# Patient Record
Sex: Female | Born: 1935 | Race: Black or African American | Hispanic: No | Marital: Single | Smoking: Former smoker
Health system: Southern US, Community
[De-identification: ages and names within clinical notes are randomized; demographics above are authoritative.]

## PROBLEM LIST (undated history)

## (undated) DIAGNOSIS — I10 Essential (primary) hypertension: Secondary | ICD-10-CM

## (undated) DIAGNOSIS — E119 Type 2 diabetes mellitus without complications: Secondary | ICD-10-CM

## (undated) HISTORY — PX: ABDOMINAL HYSTERECTOMY: SHX81

---

## 2014-08-25 ENCOUNTER — Other Ambulatory Visit: Payer: Self-pay | Admitting: Family Medicine

## 2014-08-25 DIAGNOSIS — R109 Unspecified abdominal pain: Secondary | ICD-10-CM

## 2014-08-25 DIAGNOSIS — R748 Abnormal levels of other serum enzymes: Secondary | ICD-10-CM

## 2014-08-25 DIAGNOSIS — R634 Abnormal weight loss: Secondary | ICD-10-CM

## 2014-08-31 ENCOUNTER — Ambulatory Visit
Admission: RE | Admit: 2014-08-31 | Discharge: 2014-08-31 | Disposition: A | Discharge status: Home / Self Care | Payer: Medicare Other | Source: Ambulatory Visit | Attending: Family Medicine | Admitting: Family Medicine

## 2014-08-31 DIAGNOSIS — R634 Abnormal weight loss: Secondary | ICD-10-CM

## 2014-08-31 DIAGNOSIS — R109 Unspecified abdominal pain: Secondary | ICD-10-CM

## 2014-08-31 DIAGNOSIS — R748 Abnormal levels of other serum enzymes: Secondary | ICD-10-CM

## 2014-12-30 ENCOUNTER — Other Ambulatory Visit (HOSPITAL_COMMUNITY): Payer: Self-pay | Admitting: Orthopedic Surgery

## 2014-12-30 DIAGNOSIS — M25562 Pain in left knee: Secondary | ICD-10-CM

## 2015-01-09 ENCOUNTER — Encounter (HOSPITAL_COMMUNITY): Admission: RE | Admit: 2015-01-09 | Payer: Medicare Other | Source: Ambulatory Visit

## 2015-01-09 ENCOUNTER — Encounter (HOSPITAL_COMMUNITY): Payer: Medicare Other

## 2015-01-26 ENCOUNTER — Encounter (HOSPITAL_COMMUNITY)
Admission: RE | Admit: 2015-01-26 | Discharge: 2015-01-26 | Disposition: A | Discharge status: Home / Self Care | Payer: Medicare Other | Source: Ambulatory Visit | Attending: Orthopedic Surgery | Admitting: Orthopedic Surgery

## 2015-01-26 DIAGNOSIS — M25562 Pain in left knee: Secondary | ICD-10-CM | POA: Insufficient documentation

## 2015-01-26 MED ORDER — TECHNETIUM TC 99M MEDRONATE IV KIT
25.0000 | PACK | Freq: Once | INTRAVENOUS | Status: AC | PRN
  Administered 2015-01-26: 25 via INTRAVENOUS

## 2015-01-26 MED ORDER — TECHNETIUM TC 99M MEDRONATE IV KIT: 25.0000 | PACK | Freq: Once | INTRAVENOUS | Status: AC | PRN

## 2015-07-04 ENCOUNTER — Encounter (HOSPITAL_COMMUNITY): Payer: Self-pay | Admitting: Emergency Medicine

## 2015-07-04 ENCOUNTER — Emergency Department (HOSPITAL_COMMUNITY): Payer: Medicare Other

## 2015-07-04 ENCOUNTER — Emergency Department (HOSPITAL_COMMUNITY)
Admission: EM | Admit: 2015-07-04 | Discharge: 2015-07-04 | Disposition: A | Discharge status: Home / Self Care | Payer: Medicare Other | Attending: Emergency Medicine | Admitting: Emergency Medicine

## 2015-07-04 DIAGNOSIS — E119 Type 2 diabetes mellitus without complications: Secondary | ICD-10-CM | POA: Diagnosis not present

## 2015-07-04 DIAGNOSIS — Z87891 Personal history of nicotine dependence: Secondary | ICD-10-CM | POA: Diagnosis not present

## 2015-07-04 DIAGNOSIS — I10 Essential (primary) hypertension: Secondary | ICD-10-CM | POA: Insufficient documentation

## 2015-07-04 DIAGNOSIS — R262 Difficulty in walking, not elsewhere classified: Secondary | ICD-10-CM | POA: Diagnosis not present

## 2015-07-04 DIAGNOSIS — F0390 Unspecified dementia without behavioral disturbance: Secondary | ICD-10-CM | POA: Diagnosis not present

## 2015-07-04 HISTORY — DX: Essential (primary) hypertension: I10

## 2015-07-04 HISTORY — DX: Type 2 diabetes mellitus without complications: E11.9

## 2015-07-04 LAB — CBC WITH DIFFERENTIAL/PLATELET
BASOS ABS: 0 10*3/uL (ref 0.0–0.1)
BASOS PCT: 0 % (ref 0–1)
Eosinophils Absolute: 0.1 10*3/uL (ref 0.0–0.7)
Eosinophils Relative: 1 % (ref 0–5)
HCT: 28.5 % — ABNORMAL LOW (ref 36.0–46.0)
Hemoglobin: 9.2 g/dL — ABNORMAL LOW (ref 12.0–15.0)
Lymphocytes Relative: 27 % (ref 12–46)
Lymphs Abs: 1.9 10*3/uL (ref 0.7–4.0)
MCH: 28 pg (ref 26.0–34.0)
MCHC: 32.3 g/dL (ref 30.0–36.0)
MCV: 86.6 fL (ref 78.0–100.0)
MONO ABS: 0.4 10*3/uL (ref 0.1–1.0)
MONOS PCT: 5 % (ref 3–12)
NEUTROS ABS: 4.7 10*3/uL (ref 1.7–7.7)
Neutrophils Relative %: 67 % (ref 43–77)
PLATELETS: 221 10*3/uL (ref 150–400)
RBC: 3.29 MIL/uL — AB (ref 3.87–5.11)
RDW: 15 % (ref 11.5–15.5)
WBC: 7 10*3/uL (ref 4.0–10.5)

## 2015-07-04 LAB — I-STAT TROPONIN, ED: Troponin i, poc: 0 ng/mL (ref 0.00–0.08)

## 2015-07-04 LAB — URINALYSIS, ROUTINE W REFLEX MICROSCOPIC
Bilirubin Urine: NEGATIVE
Glucose, UA: NEGATIVE mg/dL
HGB URINE DIPSTICK: NEGATIVE
KETONES UR: NEGATIVE mg/dL
Nitrite: NEGATIVE
PROTEIN: 100 mg/dL — AB
Specific Gravity, Urine: 1.014 (ref 1.005–1.030)
UROBILINOGEN UA: 0.2 mg/dL (ref 0.0–1.0)
pH: 5.5 (ref 5.0–8.0)

## 2015-07-04 LAB — COMPREHENSIVE METABOLIC PANEL
ALBUMIN: 3.6 g/dL (ref 3.5–5.0)
ALT: 13 U/L — AB (ref 14–54)
ANION GAP: 7 (ref 5–15)
AST: 20 U/L (ref 15–41)
Alkaline Phosphatase: 78 U/L (ref 38–126)
BUN: 46 mg/dL — AB (ref 6–20)
CO2: 21 mmol/L — AB (ref 22–32)
CREATININE: 1.6 mg/dL — AB (ref 0.44–1.00)
Calcium: 9.6 mg/dL (ref 8.9–10.3)
Chloride: 111 mmol/L (ref 101–111)
GFR calc Af Amer: 32 mL/min — ABNORMAL LOW (ref >60)
GFR calc non Af Amer: 28 mL/min — ABNORMAL LOW (ref >60)
Glucose, Bld: 122 mg/dL — ABNORMAL HIGH (ref 65–99)
Potassium: 4.8 mmol/L (ref 3.5–5.1)
Sodium: 139 mmol/L (ref 135–145)
Total Bilirubin: 0.4 mg/dL (ref 0.3–1.2)
Total Protein: 6.7 g/dL (ref 6.5–8.1)

## 2015-07-04 LAB — URINE MICROSCOPIC-ADD ON

## 2015-07-04 NOTE — ED Provider Notes (Signed)
CSN: [id number]     Arrival date & time 07/04/15  1100 History   First MD Initiated Contact with Patient 07/04/15 1106     Chief Complaint  Patient presents with  . Dementia     The history is provided by the patient and a relative. No language interpreter was used.   Ms. Kelly Pratt presents for evaluation of difficulty walking. Level V caveat due to dementia. Patient denies any complaints but family report that she is moving slower and is more hunched than usual. No reports of fevers, vomiting, abdominal pain, back pain. She states her right leg hurts sometimes. No recent injury. Family reports that she's had difficulty initiating gait since yesterday, this is intermittent. They think her speech is been a little different for the last several days.  Past Medical History  Diagnosis Date  . Diabetes mellitus without complication   . Hypertension    History reviewed. No pertinent past surgical history. History reviewed. No pertinent family history. History  Substance Use Topics  . Smoking status: Former Games developer  . Smokeless tobacco: Not on file  . Alcohol Use: No   OB History    No data available     Review of Systems  All other systems reviewed and are negative.     Allergies  Codeine  Home Medications   Prior to Admission medications   Not on File   There were no vitals taken for this visit. Physical Exam  Constitutional: She appears well-developed and well-nourished.  HENT:  Head: Normocephalic and atraumatic.  Eyes:  Left pupil is irregular and unreactive  Cardiovascular: Normal rate and regular rhythm.   No murmur heard. Pulmonary/Chest: Effort normal and breath sounds normal. No respiratory distress.  Abdominal: Soft. There is no tenderness. There is no rebound and no guarding.  Musculoskeletal: She exhibits no edema or tenderness.  2+ DP pulses bilaterally. No discrete tenderness over bilateral lower extremities.  Neurological: She is alert.  Disoriented to  time. Moves all extremities symmetrically.  Skin: Skin is warm and dry.  Psychiatric: She has a normal mood and affect. Her behavior is normal.  Nursing note and vitals reviewed.   ED Course  Procedures (including critical care time) Labs Review Labs Reviewed  URINALYSIS, ROUTINE W REFLEX MICROSCOPIC (NOT AT [facility]) - Abnormal; Notable for the following:    Protein, ur 100 (*)    Leukocytes, UA TRACE (*)    All other components within normal limits  COMPREHENSIVE METABOLIC PANEL - Abnormal; Notable for the following:    CO2 21 (*)    Glucose, Bld 122 (*)    BUN 46 (*)    Creatinine, Ser 1.60 (*)    ALT 13 (*)    GFR calc non Af Amer 28 (*)    GFR calc Af Amer 32 (*)    All other components within normal limits  CBC WITH DIFFERENTIAL/PLATELET - Abnormal; Notable for the following:    RBC 3.29 (*)    Hemoglobin 9.2 (*)    HCT 28.5 (*)    All other components within normal limits  URINE MICROSCOPIC-ADD ON - Abnormal; Notable for the following:    Squamous Epithelial / LPF FEW (*)    All other components within normal limits  URINE CULTURE  I-STAT TROPOININ, ED    Imaging Review Ct Head Wo Contrast  07/04/2015   CLINICAL DATA:  Slurred speech, gait abnormality and history of Alzheimer's dementia.  EXAM: CT HEAD WITHOUT CONTRAST  TECHNIQUE: Contiguous axial images were  obtained from the base of the skull through the vertex without intravenous contrast.  COMPARISON:  None.  FINDINGS: Old infarct identified in the left frontal cortex. There may also be a small old infarct near the vertex in the right subcortical frontoparietal brain. Advanced small vessel disease is present in the periventricular white matter and extending into both thalami. No acute hemorrhage, mass effect, extra-axial fluid collection or hydrocephalus identified. No evidence of mass lesion. The skull is unremarkable.  IMPRESSION: No acute findings. Old infarcts identified as well as extensive small vessel disease.    Electronically Signed   By: Irish LackGlenn  Yamagata M.D.   On: 07/04/2015 12:04     EKG Interpretation   Date/Time:  Tuesday July 04 2015 12:05:12 EDT Ventricular Rate:  69 PR Interval:  179 QRS Duration: 83 QT Interval:  415 QTC Calculation: 445 R Axis:   31 Text Interpretation:  Sinus rhythm Atrial premature complex Probable left  atrial enlargement Abnormal R-wave progression, early transition  Borderline T abnormalities, inferior leads Baseline wander in lead(s) II  III aVF Confirmed by Lincoln Brighamees, Liz [phone]) on 07/04/2015 1:22:59 PM      MDM   Final diagnoses:  Difficulty walking    Pt here for intermittent difficulty walking, sxs improved in ED. No focal neuro deficits, clinical picture not c/w TIA/CVA.  Pt with prior Cr 1.6 in Aug 2015 and Hgb 9.8 in August 2015 per discussion with her PCP office.  No evidence of ARF or acute anemia.  UA with leukocytes but no other evidence of infection - will send culture.  Discussed with family home care with outpatient follow up, return precautions.    In terms of knee pain - pt without active or reproducible pain in department, she has a hx/o knee/leg pain.     Tilden FossaElizabeth Kenora Spayd, MD 07/04/15 [phone]

## 2015-07-04 NOTE — ED Notes (Signed)
MD at bedside. 

## 2015-07-04 NOTE — ED Notes (Signed)
Nurse currently drawing labs 

## 2015-07-04 NOTE — Discharge Instructions (Signed)
Your labs look stable today.  You have some kidney disease with a creatinine of 1.6 and anemia with a hemoglobin of 9.2.  Please follow up with your family doctor for recheck.     Fall Prevention and Home Safety Falls cause injuries and can affect all age groups. It is possible to use preventive measures to significantly decrease the likelihood of falls. There are many simple measures which can make your home safer and prevent falls. OUTDOORS  Repair cracks and edges of walkways and driveways.  Remove high doorway thresholds.  Trim shrubbery on the main path into your home.  Have good outside lighting.  Clear walkways of tools, rocks, debris, and clutter.  Check that handrails are not broken and are securely fastened. Both sides of steps should have handrails.  Have leaves, snow, and ice cleared regularly.  Use sand or salt on walkways during winter months.  In the garage, clean up grease or oil spills. BATHROOM  Install night lights.  Install grab bars by the toilet and in the tub and shower.  Use non-skid mats or decals in the tub or shower.  Place a plastic non-slip stool in the shower to sit on, if needed.  Keep floors dry and clean up all water on the floor immediately.  Remove soap buildup in the tub or shower on a regular basis.  Secure bath mats with non-slip, double-sided rug tape.  Remove throw rugs and tripping hazards from the floors. BEDROOMS  Install night lights.  Make sure a bedside light is easy to reach.  Do not use oversized bedding.  Keep a telephone by your bedside.  Have a firm chair with side arms to use for getting dressed.  Remove throw rugs and tripping hazards from the floor. KITCHEN  Keep handles on pots and pans turned toward the center of the stove. Use back burners when possible.  Clean up spills quickly and allow time for drying.  Avoid walking on wet floors.  Avoid hot utensils and knives.  Position shelves so they are  not too high or low.  Place commonly used objects within easy reach.  If necessary, use a sturdy step stool with a grab bar when reaching.  Keep electrical cables out of the way.  Do not use floor polish or wax that makes floors slippery. If you must use wax, use non-skid floor wax.  Remove throw rugs and tripping hazards from the floor. STAIRWAYS  Never leave objects on stairs.  Place handrails on both sides of stairways and use them. Fix any loose handrails. Make sure handrails on both sides of the stairways are as long as the stairs.  Check carpeting to make sure it is firmly attached along stairs. Make repairs to worn or loose carpet promptly.  Avoid placing throw rugs at the top or bottom of stairways, or properly secure the rug with carpet tape to prevent slippage. Get rid of throw rugs, if possible.  Have an electrician put in a light switch at the top and bottom of the stairs. OTHER FALL PREVENTION TIPS  Wear low-heel or rubber-soled shoes that are supportive and fit well. Wear closed toe shoes.  When using a stepladder, make sure it is fully opened and both spreaders are firmly locked. Do not climb a closed stepladder.  Add color or contrast paint or tape to grab bars and handrails in your home. Place contrasting color strips on first and last steps.  Learn and use mobility aids as needed. Install an  electrical emergency response system.  Turn on lights to avoid dark areas. Replace light bulbs that burn out immediately. Get light switches that glow.  Arrange furniture to create clear pathways. Keep furniture in the same place.  Firmly attach carpet with non-skid or double-sided tape.  Eliminate uneven floor surfaces.  Select a carpet pattern that does not visually hide the edge of steps.  Be aware of all pets. OTHER HOME SAFETY TIPS  Set the water temperature for 120 F (48.8 C).  Keep emergency numbers on or near the telephone.  Keep smoke detectors on  every level of the home and near sleeping areas. Document Released: 11/29/2002 Document Revised: 06/09/2012 Document Reviewed: 02/28/2012 [facility] Patient Information 2015 [employer] [place]. This information is not intended to replace advice given to you by your health care provider. Make sure you discuss any questions you have with your health care provider.

## 2015-07-04 NOTE — ED Notes (Signed)
Bed: WA20 Expected date:  Expected time:  Means of arrival:  Comments: EMS-difficulty ambulating

## 2015-07-04 NOTE — ED Notes (Signed)
Per EMS- patient herself reports no complaints. Family reports pt has been "moving slower than usual" and is "more hunched over than usual." Hx dementia, Alzheimer's. VS: 181/80 HR 70 RR 16 SpO2 100%. No other c/c.

## 2015-07-04 NOTE — ED Notes (Signed)
Patient transported to CT 

## 2015-07-05 LAB — URINE CULTURE: Culture: NO GROWTH

## 2015-12-18 ENCOUNTER — Emergency Department (HOSPITAL_COMMUNITY): Payer: Medicare Other

## 2015-12-18 ENCOUNTER — Emergency Department (HOSPITAL_COMMUNITY)
Admission: EM | Admit: 2015-12-18 | Discharge: 2015-12-18 | Disposition: A | Discharge status: Home / Self Care | Payer: Medicare Other | Attending: Emergency Medicine | Admitting: Emergency Medicine

## 2015-12-18 ENCOUNTER — Encounter (HOSPITAL_COMMUNITY): Payer: Self-pay | Admitting: Emergency Medicine

## 2015-12-18 DIAGNOSIS — Y998 Other external cause status: Secondary | ICD-10-CM | POA: Diagnosis not present

## 2015-12-18 DIAGNOSIS — Z7984 Long term (current) use of oral hypoglycemic drugs: Secondary | ICD-10-CM | POA: Insufficient documentation

## 2015-12-18 DIAGNOSIS — I10 Essential (primary) hypertension: Secondary | ICD-10-CM | POA: Diagnosis not present

## 2015-12-18 DIAGNOSIS — S8992XA Unspecified injury of left lower leg, initial encounter: Secondary | ICD-10-CM | POA: Insufficient documentation

## 2015-12-18 DIAGNOSIS — Z79899 Other long term (current) drug therapy: Secondary | ICD-10-CM | POA: Insufficient documentation

## 2015-12-18 DIAGNOSIS — W19XXXA Unspecified fall, initial encounter: Secondary | ICD-10-CM

## 2015-12-18 DIAGNOSIS — Z87891 Personal history of nicotine dependence: Secondary | ICD-10-CM | POA: Insufficient documentation

## 2015-12-18 DIAGNOSIS — Y92003 Bedroom of unspecified non-institutional (private) residence as the place of occurrence of the external cause: Secondary | ICD-10-CM | POA: Diagnosis not present

## 2015-12-18 DIAGNOSIS — Y9389 Activity, other specified: Secondary | ICD-10-CM | POA: Diagnosis not present

## 2015-12-18 DIAGNOSIS — W1839XA Other fall on same level, initial encounter: Secondary | ICD-10-CM | POA: Insufficient documentation

## 2015-12-18 DIAGNOSIS — E119 Type 2 diabetes mellitus without complications: Secondary | ICD-10-CM | POA: Diagnosis not present

## 2015-12-18 LAB — CBC WITH DIFFERENTIAL/PLATELET
Basophils Absolute: 0 10*3/uL (ref 0.0–0.1)
Basophils Relative: 0 %
EOS ABS: 0.1 10*3/uL (ref 0.0–0.7)
Eosinophils Relative: 1 %
HEMATOCRIT: 28.5 % — AB (ref 36.0–46.0)
HEMOGLOBIN: 9.3 g/dL — AB (ref 12.0–15.0)
LYMPHS PCT: 15 %
Lymphs Abs: 1.5 10*3/uL (ref 0.7–4.0)
MCH: 28.1 pg (ref 26.0–34.0)
MCHC: 32.6 g/dL (ref 30.0–36.0)
MCV: 86.1 fL (ref 78.0–100.0)
MONOS PCT: 7 %
Monocytes Absolute: 0.7 10*3/uL (ref 0.1–1.0)
NEUTROS ABS: 7.7 10*3/uL (ref 1.7–7.7)
Neutrophils Relative %: 77 %
Platelets: 248 10*3/uL (ref 150–400)
RBC: 3.31 MIL/uL — AB (ref 3.87–5.11)
RDW: 15.4 % (ref 11.5–15.5)
WBC: 9.9 10*3/uL (ref 4.0–10.5)

## 2015-12-18 LAB — URINALYSIS, ROUTINE W REFLEX MICROSCOPIC
Bilirubin Urine: NEGATIVE
Glucose, UA: NEGATIVE mg/dL
KETONES UR: NEGATIVE mg/dL
Leukocytes, UA: NEGATIVE
NITRITE: NEGATIVE
PH: 5.5 (ref 5.0–8.0)
PROTEIN: 100 mg/dL — AB
Specific Gravity, Urine: 1.013 (ref 1.005–1.030)

## 2015-12-18 LAB — COMPREHENSIVE METABOLIC PANEL
ALK PHOS: 92 U/L (ref 38–126)
ALT: 24 U/L (ref 14–54)
AST: 36 U/L (ref 15–41)
Albumin: 3.9 g/dL (ref 3.5–5.0)
Anion gap: 10 (ref 5–15)
BILIRUBIN TOTAL: 0.8 mg/dL (ref 0.3–1.2)
BUN: 52 mg/dL — ABNORMAL HIGH (ref 6–20)
CALCIUM: 9.5 mg/dL (ref 8.9–10.3)
CO2: 20 mmol/L — ABNORMAL LOW (ref 22–32)
Chloride: 112 mmol/L — ABNORMAL HIGH (ref 101–111)
Creatinine, Ser: 1.71 mg/dL — ABNORMAL HIGH (ref 0.44–1.00)
GFR calc non Af Amer: 26 mL/min — ABNORMAL LOW (ref >60)
GFR, EST AFRICAN AMERICAN: 30 mL/min — AB (ref >60)
Glucose, Bld: 111 mg/dL — ABNORMAL HIGH (ref 65–99)
Potassium: 4.8 mmol/L (ref 3.5–5.1)
SODIUM: 142 mmol/L (ref 135–145)
TOTAL PROTEIN: 7.4 g/dL (ref 6.5–8.1)

## 2015-12-18 LAB — URINE MICROSCOPIC-ADD ON

## 2015-12-18 MED ORDER — HYDROCODONE-ACETAMINOPHEN 5-325 MG PO TABS: 1.0000 | ORAL_TABLET | Freq: Four times a day (QID) | ORAL | Status: DC | PRN

## 2015-12-18 MED ORDER — HYDROCODONE-ACETAMINOPHEN 5-325 MG PO TABS
1.0000 | ORAL_TABLET | Freq: Four times a day (QID) | ORAL | Status: DC | PRN
Start: 2015-12-18 — End: 2016-05-14

## 2015-12-18 MED ORDER — OXYCODONE-ACETAMINOPHEN 5-325 MG PO TABS
1.0000 | ORAL_TABLET | Freq: Once | ORAL | Status: AC
  Administered 2015-12-18: 1 via ORAL
  Filled 2015-12-18: qty 1

## 2015-12-18 NOTE — Progress Notes (Addendum)
   12/18/15 0000  CM Assessment  Expected Discharge Plan Home w Home Health Services  In-house Referral Clinical Social Work  Discharge Planning Services CM Consult  [facility]  Choice offered to / list presented to  Patient;Adult Children  HH Arranged RN;PT;Nurse's Aide;OT;Social Work  [facility]  Status of Service Completed, signed off  Discharge Disposition Home w Home Health Services   81 yr old medicare pt fell injury to knee to have knee immobilizer applied for d/c  Daughter at bedside Marcelino DusterMichelle states pt baseline is walking with walker States pt has a bedside commode w/c, walker and cane at [place] pt stays with her for a week and then with her sister for a week.  Pt was with Marcelino DusterMichelle this weekend.  [facility] and choice is to have them for services again If no availability for [facility] second choice [facility]  Cm discussed with Marcelino DusterMichelle that pt can be d/c with assist of EMS Marcelino DusterMichelle states pt will be returning to her sister Geoffery SpruceDebbie, [place] [phone] cell CM updated [facility] address [facility] as [address] [place] [id number] for [place] purposes Updated ED RN   CM reviewed in details medicare guidelines, home health (HH) (length of stay in home, types of [facility] staff available, coverage, primary caregiver, up to 24 hrs before services may be started) and Private duty nursing (PDN-coverage, length of stay in the home types of staff available). CM reviewed availability of HH SW to assist pcp to get pt to snf (if desired disposition) from the community level. CM provided pt/family with a list of [place] home health agencies and PDN.  CM explained medicare guidelines about pt not being able to get to "inpatient rehab" from ED or snf rehab without qualifying 3 day stay Explained that ED is considered outpatient Discussed there is an out of pocket expense for placement not  qualified Marcelino DusterMichelle voiced understanding Discussed that [facility] will call within 24 hr disclaimer to discuss any co pays, schedule of visits and start date, etc Marcelino DusterMichelle voiced understanding Spoke with Debbie via cell phone who also voiced understanding and appreciation for Cm working with her mother  1252 Cm called in referral for services to Clydie BraunKaren [facility] No DME needs

## 2015-12-18 NOTE — ED Notes (Addendum)
Pt transported from home after fall at unkn time during the night. Found on the floor this am by daughter.unkn LOC. C/o L knee pain, A & O  L knee L femur painful on palpation, pt states she had several drinks last night but does was not intoxicated. Pt is unable to estimate what time she fell during the night, she was unable get up or get daughters attention. Pt repeatedly asking what hospital this is.

## 2015-12-18 NOTE — ED Notes (Signed)
Daughter request meal for pt and pt to take morning home medications; per Zammit okay for pt to be given something to eat to take home medications brought with her; minus tylenol (pt given percocet). Pt given ice water, graham crackers, and ice water.

## 2015-12-18 NOTE — ED Notes (Signed)
Nurse from [facility] has come to talk with the pt's daughter.

## 2015-12-18 NOTE — ED Notes (Signed)
Per ortho tech:  After placing knee immobilizer, he stood the pt up at the bedside.  She was able to bear weight without c/o any pain.  EDP made aware.

## 2015-12-18 NOTE — Progress Notes (Signed)
CSW staffed with nurse before speaking with patient. CSW spoke with patient at bedside. Patient stated she was hospitalized due to a fall. Patient then dozed off to sleep. Patients daughter, Kelly Pratt, daughter stated patient was given a percocet.   Patient's daughter stated she and patient arrived home from a party and that they had some wine. Patient's daughter stated her mother's knees were like rubber. Patient's daughter stated her mother had knee replacement twenty years ago. Her daughter asked her mother to stay in place until she returned from taking her sister home. She stated when she returned, patient was on the floor.    Patient's daughter stated patient resides with her at her home. Patient's daughter stated she has other siblings and patient stays with each sibling four months at a time. Patient's daughter stated [facility] [address] [place] which she states is an adult day care. Patient's daughter states patient loves to go to the center.   Patient's daughter stated patient is retired and she [place] moved [place] from [place] last year. She states patient was able to dress herself prior to admission. Patient's daughter states she gives her mother a bath. She states patient uses a cane, walker, and a wheelchair.   Patient's daughter had no questions for CSW at the time.   Kelly Pratt, daughter, [phone]  Kelly Pratt, LCSWA [phone] ED CSW 12/18/2015 11:00 AM

## 2015-12-18 NOTE — ED Notes (Signed)
Called ortho tech to apply knee immobilizer 

## 2015-12-18 NOTE — Discharge Instructions (Signed)
Take the hydrocodone for pain if Tylenol or Motrin does not help. Follow-up with your family doctor next week

## 2015-12-18 NOTE — ED Provider Notes (Signed)
CSN: [id number]     Arrival date & time 12/18/15  [id number] History   First MD Initiated Contact with Patient 12/18/15 [phone]     Chief Complaint  Patient presents with  . Fall     (Consider location/radiation/quality/duration/timing/severity/associated sxs/prior Treatment) Patient is a 81 y.o. female presenting with fall. The history is provided by a relative (The patient fell last night slowly in the bedroom. The patient complains of pain in her left knee).  Fall This is a new problem. The current episode started 12 to 24 hours ago. The problem occurs constantly. The problem has not changed since onset.Pertinent negatives include no chest pain, no abdominal pain and no headaches. Exacerbated by: Movement of the knee. Nothing relieves the symptoms. She has tried nothing for the symptoms.    Past Medical History  Diagnosis Date  . Diabetes mellitus without complication (HCC)   . Hypertension    Past Surgical History  Procedure Laterality Date  . Abdominal hysterectomy     No family history on file. Social History  Substance Use Topics  . Smoking status: Former Games developermoker  . Smokeless tobacco: None  . Alcohol Use: No   OB History    No data available     Review of Systems  Constitutional: Negative for appetite change and fatigue.  HENT: Negative for congestion, ear discharge and sinus pressure.   Eyes: Negative for discharge.  Respiratory: Negative for cough.   Cardiovascular: Negative for chest pain.  Gastrointestinal: Negative for abdominal pain and diarrhea.  Genitourinary: Negative for frequency and hematuria.  Musculoskeletal: Negative for back pain.       Knee pain  Skin: Negative for rash.  Neurological: Negative for seizures and headaches.  Psychiatric/Behavioral: Negative for hallucinations.      Allergies  Codeine  Home Medications   Prior to Admission medications   Medication Sig Start Date End Date Taking? Authorizing Provider  acetaminophen (TYLENOL) 500  MG tablet Take 1,000 mg by mouth 2 (two) times daily.   Yes Historical Provider, MD  amLODipine (NORVASC) 5 MG tablet Take 5 mg by mouth daily. 05/01/15  Yes Historical Provider, MD  atorvastatin (LIPITOR) 20 MG tablet Take 20 mg by mouth daily.   Yes Historical Provider, MD  donepezil (ARICEPT) 5 MG tablet Take 5 mg by mouth at bedtime. 04/21/15  Yes Historical Provider, MD  ezetimibe (ZETIA) 10 MG tablet Take 10 mg by mouth daily.   Yes Historical Provider, MD  hydrochlorothiazide (MICROZIDE) 12.5 MG capsule Take 12.5 mg by mouth daily.   Yes Historical Provider, MD  metFORMIN (GLUCOPHAGE) 500 MG tablet Take 500 mg by mouth daily with breakfast.  05/30/15  Yes Historical Provider, MD  Multiple Vitamin (MULTIVITAMIN WITH MINERALS) TABS tablet Take 1 tablet by mouth daily.   Yes Historical Provider, MD  valsartan (DIOVAN) 160 MG tablet Take 160 mg by mouth daily. 04/18/15  Yes Historical Provider, MD  HYDROcodone-acetaminophen (NORCO/VICODIN) 5-325 MG tablet Take 1 tablet by mouth every 6 (six) hours as needed for moderate pain. 12/18/15   Bethann BerkshireJoseph Bevin Mayall, MD   BP 176/88 mmHg  Pulse 87  Temp(Src) 97.8 F (36.6 C) (Oral)  Resp 17  SpO2 100% Physical Exam  Constitutional: She is oriented to person, place, and time. She appears well-developed.  HENT:  Head: Normocephalic.  Eyes: Conjunctivae and EOM are normal. No scleral icterus.  Neck: Neck supple. No thyromegaly present.  Cardiovascular: Normal rate and regular rhythm.  Exam reveals no gallop and no friction rub.  No murmur heard. Pulmonary/Chest: No stridor. She has no wheezes. She has no rales. She exhibits no tenderness.  Abdominal: She exhibits no distension. There is no tenderness. There is no rebound.  Musculoskeletal: Normal range of motion. She exhibits tenderness. She exhibits no edema.  Tender left knee no swelling. Neurovascular normal  Lymphadenopathy:    She has no cervical adenopathy.  Neurological: She is oriented to person,  place, and time. She exhibits normal muscle tone. Coordination normal.  Skin: No rash noted. No erythema.  Psychiatric: She has a normal mood and affect. Her behavior is normal.    ED Course  Procedures (including critical care time) Labs Review Labs Reviewed  CBC WITH DIFFERENTIAL/PLATELET - Abnormal; Notable for the following:    RBC 3.31 (*)    Hemoglobin 9.3 (*)    HCT 28.5 (*)    All other components within normal limits  COMPREHENSIVE METABOLIC PANEL - Abnormal; Notable for the following:    Chloride 112 (*)    CO2 20 (*)    Glucose, Bld 111 (*)    BUN 52 (*)    Creatinine, Ser 1.71 (*)    GFR calc non Af Amer 26 (*)    GFR calc Af Amer 30 (*)    All other components within normal limits  URINALYSIS, ROUTINE W REFLEX MICROSCOPIC (NOT AT [facility]) - Abnormal; Notable for the following:    Hgb urine dipstick TRACE (*)    Protein, ur 100 (*)    All other components within normal limits  URINE MICROSCOPIC-ADD ON - Abnormal; Notable for the following:    Squamous Epithelial / LPF 0-5 (*)    Bacteria, UA RARE (*)    All other components within normal limits    Imaging Review Dg Pelvis 1-2 Views  12/18/2015  CLINICAL DATA:  Found on floor by daughter, complaining of left knee pain. EXAM: PELVIS - 1-2 VIEW COMPARISON:  None. FINDINGS: There is no evidence of pelvic fracture or dislocation. There is narrow bilateral hip joint spaces. IMPRESSION: No acute fracture or dislocation. Degenerative joint changes of bilateral hips. Electronically Signed   By: Sherian Rein M.D.   On: 12/18/2015 07:22   Dg Ankle Complete Left  12/18/2015  CLINICAL DATA:  Fall last night. Pt c/o generalized left ankle pain. Unable to flex foot. No previous sx. EXAM: LEFT ANKLE COMPLETE - 3+ VIEW COMPARISON:  None. FINDINGS: There is no evidence of fracture, dislocation, or joint effusion. Mild osteopenia. Small posterior spurs from the talar articular surface. There is no evidence of arthropathy or other focal  bone abnormality. Arterial calcifications in the lower calf. Soft tissues are unremarkable. IMPRESSION: No acute abnormality. Electronically Signed   By: Corlis Leak M.D.   On: 12/18/2015 10:42   Dg Knee Complete 4 Views Left  12/18/2015  CLINICAL DATA:  Status post fall at home last night with left knee pain. EXAM: LEFT KNEE - COMPLETE 4+ VIEW COMPARISON:  None. FINDINGS: Left knee replacement is identified. There is no malalignment. There is no acute fracture or dislocation. IMPRESSION: No acute fracture or dislocation. Left knee replacement without malalignment. Electronically Signed   By: Sherian Rein M.D.   On: 12/18/2015 07:56   I have personally reviewed and evaluated these images and lab results as part of my medical decision-making.   EKG Interpretation None      MDM   Final diagnoses:  Fall, initial encounter    Patient can no longer walk because the discomfort in her  left knee. She is given a knee immobilizer and is getting home health nursing to help. She is given a prescription of Vicodin and will follow-up with her PCP    Bethann Berkshire, MD 12/18/15 1326

## 2015-12-18 NOTE — ED Notes (Signed)
[facility] called for ride home  

## 2015-12-18 NOTE — ED Notes (Signed)
Bed: WA03 Expected date:  Expected time:  Means of arrival:  Comments: 81 yr old fall, knee pain

## 2016-01-24 DEATH — deceased

## 2016-02-01 IMAGING — CT CT HEAD W/O CM
2 series · 16 of 30 positions shown, 20 images · non-contrast
Comparison: None.

CLINICAL DATA: Slurred speech, gait abnormality and history of
Alzheimer's dementia.

EXAM:
CT HEAD WITHOUT CONTRAST
TECHNIQUE: Contiguous axial images were obtained from the base of the skull
through the vertex without intravenous contrast.

[Series 2: head w/o · axial · non-contrast · 0.45mm/px · z∈[+356,+486]mm · 13 of 32 slices shown, 17 images]
[im 3/32  brain]
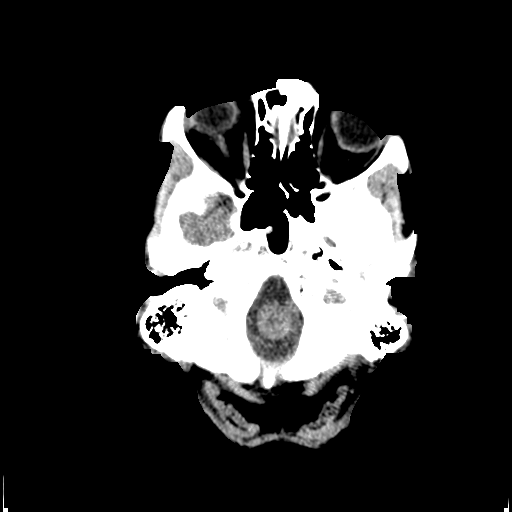
[im 3/32  bone]
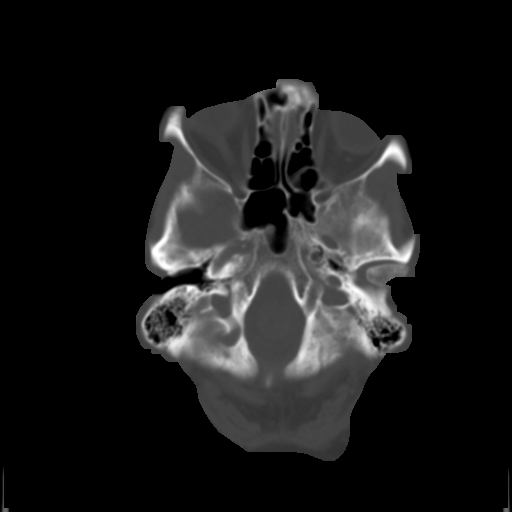
[im 5/32  brain]
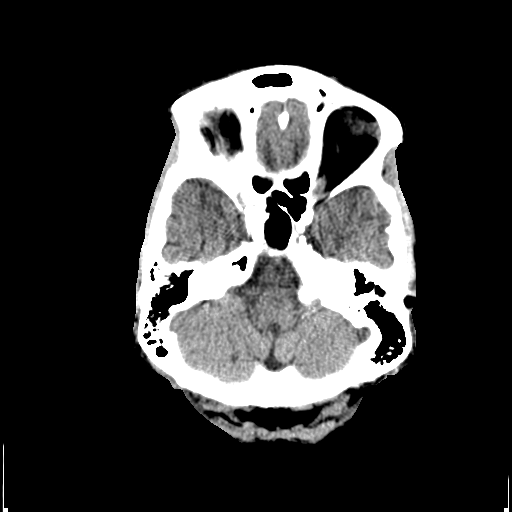
[im 7/32  brain]
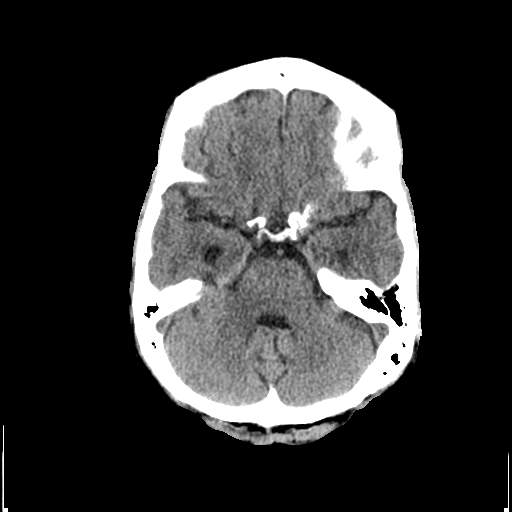
[im 9/32  brain]
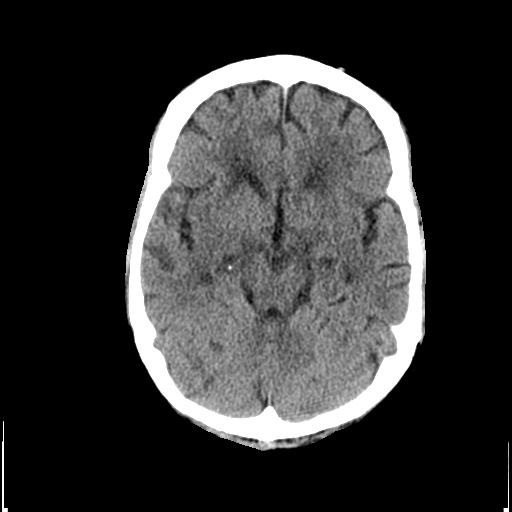
[im 12/32  brain]
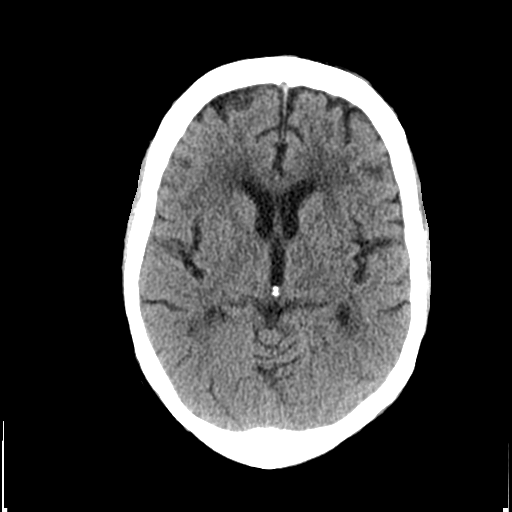
[im 12/32  bone]
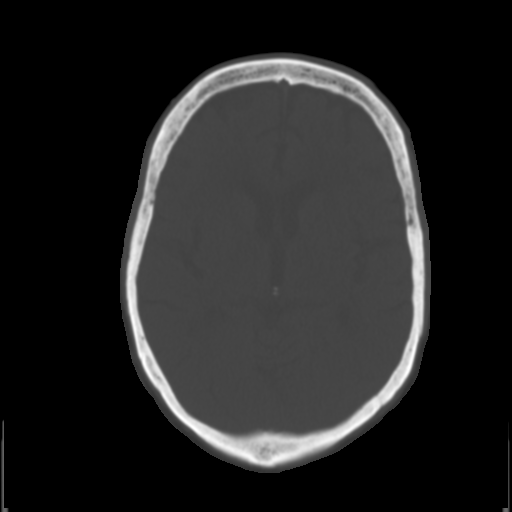
[im 14/32  brain]
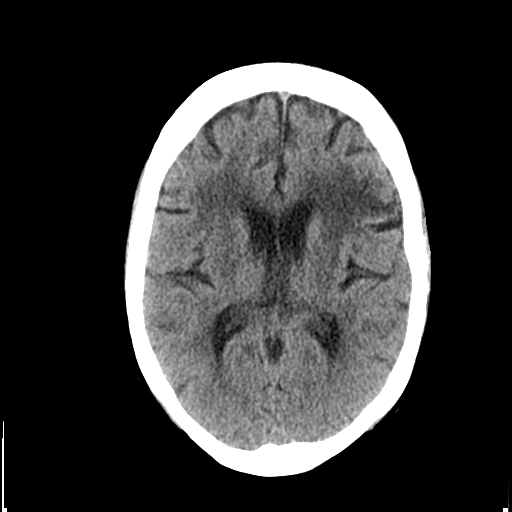
[im 16/32  brain]
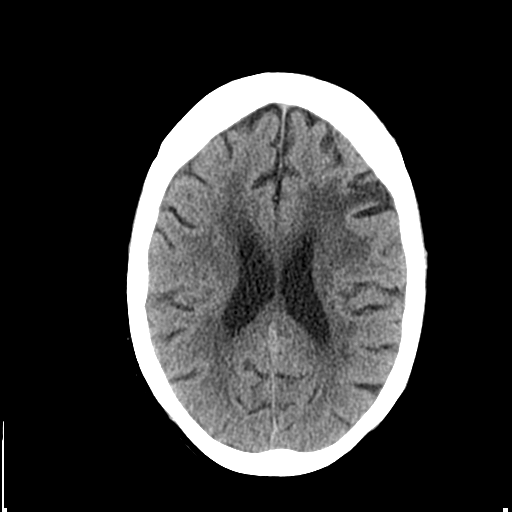
[im 18/32  brain]
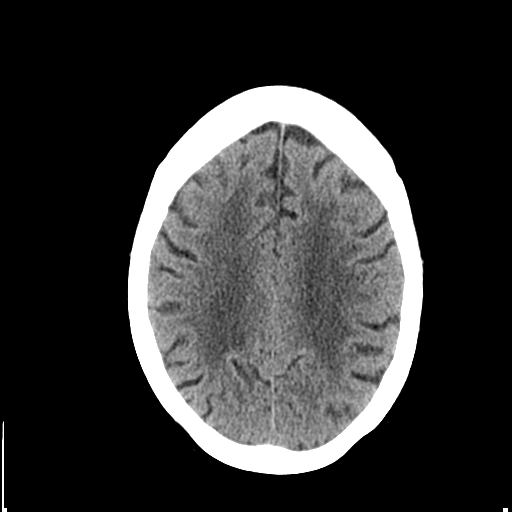
[im 20/32  brain]
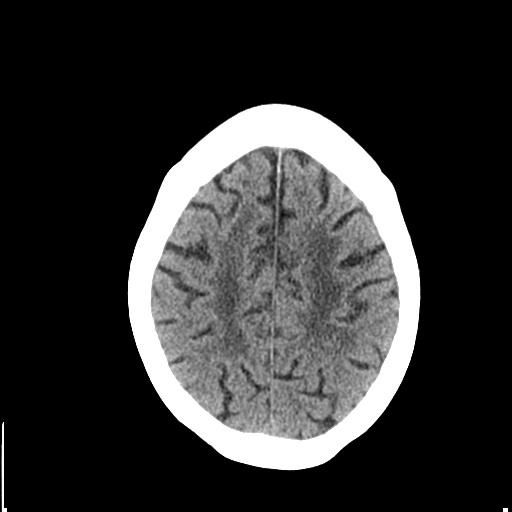
[im 20/32  bone]
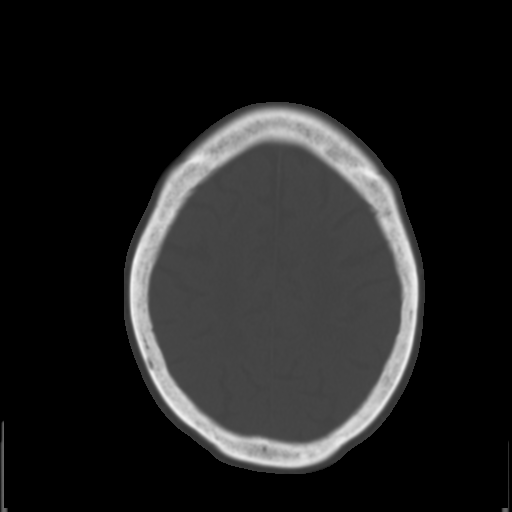
[im 23/32  brain]
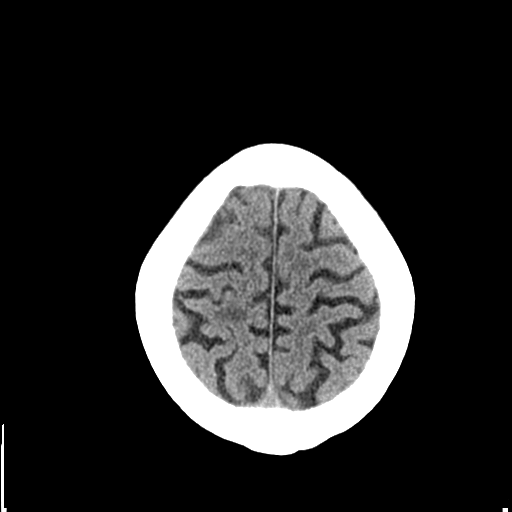
[im 25/32  brain]
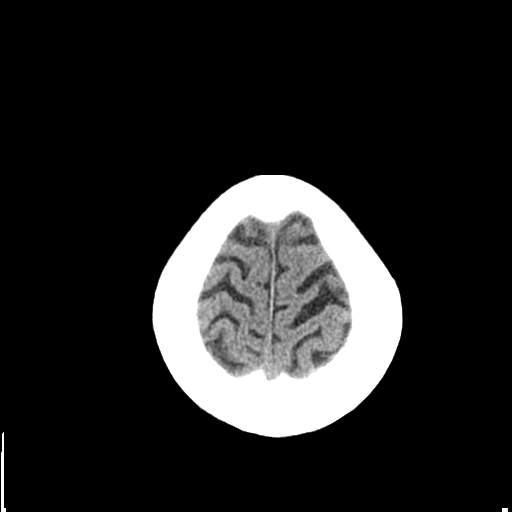
[im 27/32  brain]
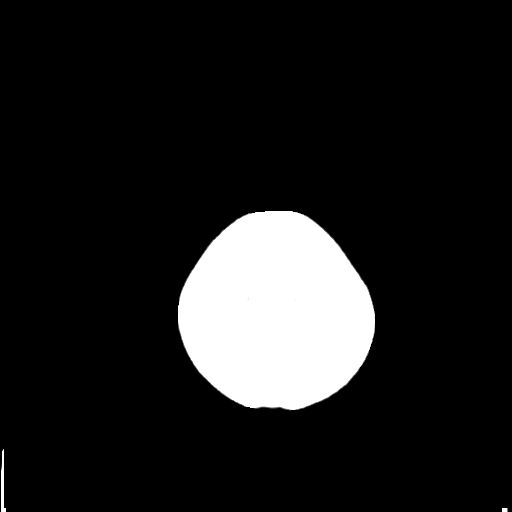
[im 29/32  brain]
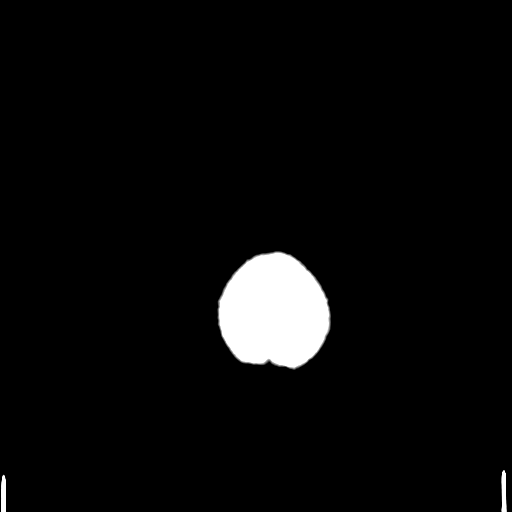
[im 29/32  bone]
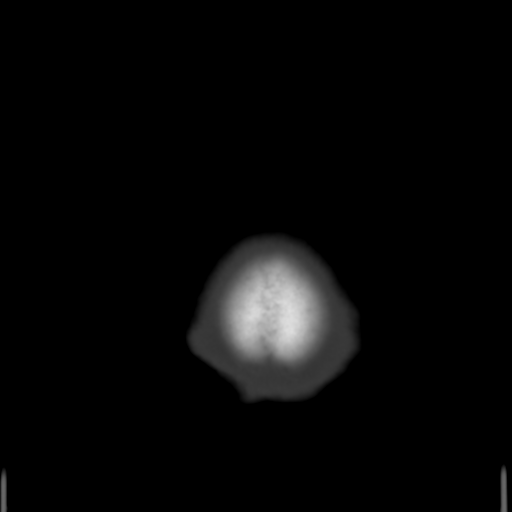

[Series 3: bone windows · axial · 0.45mm/px · z∈[+356,+401]mm · 3 of 32 slices shown]
[im 3/32  bone]
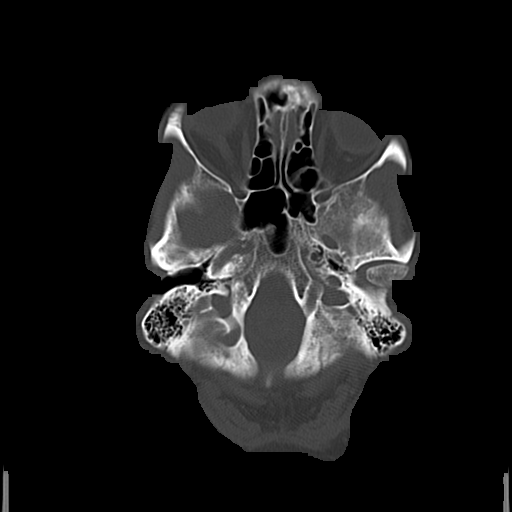
[im 7/32  bone]
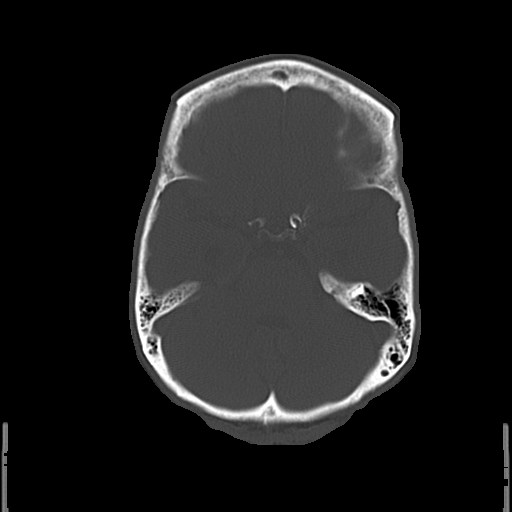
[im 12/32  bone]
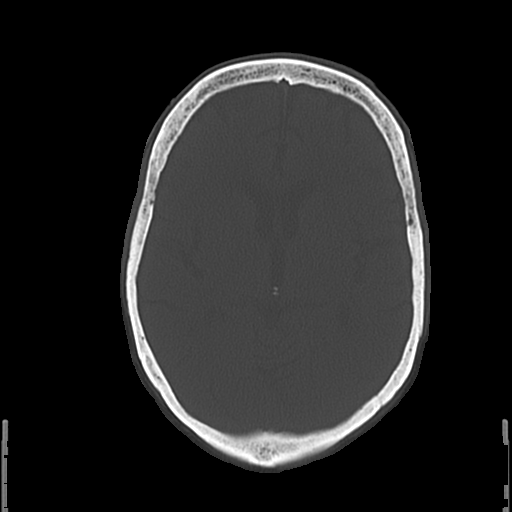

[16 of 30 positions shown; findings below may reference images not displayed]

FINDINGS: Old infarct identified in the left frontal cortex. There may also be
a small old infarct near the vertex in the right subcortical
frontoparietal brain. Advanced small vessel disease is present in
the periventricular white matter and extending into both thalami. No
acute hemorrhage, mass effect, extra-axial fluid collection or
hydrocephalus identified. No evidence of mass lesion. The skull is
unremarkable.
IMPRESSION: No acute findings. Old infarcts identified as well as extensive
small vessel disease.

## 2016-04-01 ENCOUNTER — Emergency Department (HOSPITAL_COMMUNITY): Payer: Medicare Other

## 2016-04-01 ENCOUNTER — Emergency Department (HOSPITAL_COMMUNITY)
Admission: EM | Admit: 2016-04-01 | Discharge: 2016-04-01 | Disposition: A | Discharge status: Home / Self Care | Payer: Medicare Other | Attending: Emergency Medicine | Admitting: Emergency Medicine

## 2016-04-01 ENCOUNTER — Encounter (HOSPITAL_COMMUNITY): Payer: Self-pay | Admitting: Emergency Medicine

## 2016-04-01 DIAGNOSIS — Z79899 Other long term (current) drug therapy: Secondary | ICD-10-CM | POA: Insufficient documentation

## 2016-04-01 DIAGNOSIS — F0390 Unspecified dementia without behavioral disturbance: Secondary | ICD-10-CM | POA: Insufficient documentation

## 2016-04-01 DIAGNOSIS — R011 Cardiac murmur, unspecified: Secondary | ICD-10-CM | POA: Diagnosis not present

## 2016-04-01 DIAGNOSIS — I10 Essential (primary) hypertension: Secondary | ICD-10-CM | POA: Insufficient documentation

## 2016-04-01 DIAGNOSIS — E119 Type 2 diabetes mellitus without complications: Secondary | ICD-10-CM | POA: Diagnosis not present

## 2016-04-01 DIAGNOSIS — Z7984 Long term (current) use of oral hypoglycemic drugs: Secondary | ICD-10-CM | POA: Diagnosis not present

## 2016-04-01 DIAGNOSIS — Z87891 Personal history of nicotine dependence: Secondary | ICD-10-CM | POA: Insufficient documentation

## 2016-04-01 DIAGNOSIS — R531 Weakness: Secondary | ICD-10-CM

## 2016-04-01 DIAGNOSIS — D649 Anemia, unspecified: Secondary | ICD-10-CM | POA: Diagnosis not present

## 2016-04-01 LAB — BASIC METABOLIC PANEL
ANION GAP: 8 (ref 5–15)
BUN: 55 mg/dL — ABNORMAL HIGH (ref 6–20)
CALCIUM: 9.4 mg/dL (ref 8.9–10.3)
CO2: 20 mmol/L — ABNORMAL LOW (ref 22–32)
Chloride: 114 mmol/L — ABNORMAL HIGH (ref 101–111)
Creatinine, Ser: 2.14 mg/dL — ABNORMAL HIGH (ref 0.44–1.00)
GFR calc Af Amer: 23 mL/min — ABNORMAL LOW (ref >60)
GFR, EST NON AFRICAN AMERICAN: 19 mL/min — AB (ref >60)
GLUCOSE: 114 mg/dL — AB (ref 65–99)
POTASSIUM: 4.5 mmol/L (ref 3.5–5.1)
SODIUM: 142 mmol/L (ref 135–145)

## 2016-04-01 LAB — URINALYSIS, ROUTINE W REFLEX MICROSCOPIC
Bilirubin Urine: NEGATIVE
GLUCOSE, UA: NEGATIVE mg/dL
HGB URINE DIPSTICK: NEGATIVE
Ketones, ur: NEGATIVE mg/dL
LEUKOCYTES UA: NEGATIVE
NITRITE: NEGATIVE
PH: 5.5 (ref 5.0–8.0)
PROTEIN: 30 mg/dL — AB
Specific Gravity, Urine: 1.018 (ref 1.005–1.030)

## 2016-04-01 LAB — CBC
HEMATOCRIT: 25.3 % — AB (ref 36.0–46.0)
HEMOGLOBIN: 8 g/dL — AB (ref 12.0–15.0)
MCH: 26.8 pg (ref 26.0–34.0)
MCHC: 31.6 g/dL (ref 30.0–36.0)
MCV: 84.6 fL (ref 78.0–100.0)
Platelets: 258 10*3/uL (ref 150–400)
RBC: 2.99 MIL/uL — ABNORMAL LOW (ref 3.87–5.11)
RDW: 15.5 % (ref 11.5–15.5)
WBC: 9 10*3/uL (ref 4.0–10.5)

## 2016-04-01 LAB — HEPATIC FUNCTION PANEL
ALK PHOS: 81 U/L (ref 38–126)
ALT: 12 U/L — AB (ref 14–54)
AST: 23 U/L (ref 15–41)
Albumin: 3.6 g/dL (ref 3.5–5.0)
BILIRUBIN TOTAL: 0.2 mg/dL — AB (ref 0.3–1.2)
Bilirubin, Direct: <0.1 mg/dL — ABNORMAL LOW (ref 0.1–0.5)
Total Protein: 7 g/dL (ref 6.5–8.1)

## 2016-04-01 LAB — URINE MICROSCOPIC-ADD ON: RBC / HPF: NONE SEEN RBC/hpf (ref 0–5)

## 2016-04-01 LAB — BRAIN NATRIURETIC PEPTIDE: B Natriuretic Peptide: 262.5 pg/mL — ABNORMAL HIGH (ref 0.0–100.0)

## 2016-04-01 LAB — CBG MONITORING, ED: Glucose-Capillary: 107 mg/dL — ABNORMAL HIGH (ref 65–99)

## 2016-04-01 LAB — POC OCCULT BLOOD, ED: Fecal Occult Bld: NEGATIVE

## 2016-04-01 LAB — I-STAT CG4 LACTIC ACID, ED: Lactic Acid, Venous: 1.99 mmol/L (ref 0.5–2.0)

## 2016-04-01 NOTE — Discharge Instructions (Signed)
Anemia, Nonspecific Anemia is a condition in which the concentration of red blood cells or hemoglobin in the blood is below normal. Hemoglobin is a substance in red blood cells that carries oxygen to the tissues of the body. Anemia results in not enough oxygen reaching these tissues.  CAUSES  Common causes of anemia include:   Excessive bleeding. Bleeding may be internal or external. This includes excessive bleeding from periods (in women) or from the intestine.   Poor nutrition.   Chronic kidney, thyroid, and liver disease.  Bone marrow disorders that decrease red blood cell production.  Cancer and treatments for cancer.  HIV, AIDS, and their treatments.  Spleen problems that increase red blood cell destruction.  Blood disorders.  Excess destruction of red blood cells due to infection, medicines, and autoimmune disorders. SIGNS AND SYMPTOMS   Minor weakness.   Dizziness.   Headache.  Palpitations.   Shortness of breath, especially with exercise.   Paleness.  Cold sensitivity.  Indigestion.  Nausea.  Difficulty sleeping.  Difficulty concentrating. Symptoms may occur suddenly or they may develop slowly.  DIAGNOSIS  Additional blood tests are often needed. These help your health care provider determine the best treatment. Your health care provider will check your stool for blood and look for other causes of blood loss.  TREATMENT  Treatment varies depending on the cause of the anemia. Treatment can include:   Supplements of iron, vitamin B12, or folic acid.   Hormone medicines.   A blood transfusion. This may be needed if blood loss is severe.   Hospitalization. This may be needed if there is significant continual blood loss.   Dietary changes.  Spleen removal. HOME CARE INSTRUCTIONS Keep all follow-up appointments. It often takes many weeks to correct anemia, and having your health care provider check on your condition and your response to  treatment is very important. SEEK IMMEDIATE MEDICAL CARE IF:   You develop extreme weakness, shortness of breath, or chest pain.   You become dizzy or have trouble concentrating.  You develop heavy vaginal bleeding.   You develop a rash.   You have bloody or black, tarry stools.   You faint.   You vomit up blood.   You vomit repeatedly.   You have abdominal pain.  You have a fever or persistent symptoms for more than 2-3 days.   You have a fever and your symptoms suddenly get worse.   You are dehydrated.  MAKE SURE YOU:  Understand these instructions.  Will watch your condition.  Will get help right away if you are not doing well or get worse.   This information is not intended to replace advice given to you by your health care provider. Make sure you discuss any questions you have with your health care provider.   Document Released: 01/16/2005 Document Revised: 08/11/2013 Document Reviewed: 06/04/2013 Elsevier Interactive Patient Education 2016 [employer]  

## 2016-04-01 NOTE — ED Notes (Signed)
Per EMS, Pt from home, Pt c/o generalized weakness since she woke up this morning. Denies N/V/D, SOB, pain. Stroke screen negative. Pt was slightly weaker in R knee but has hx of chronic R knee issues. Pt febrile at 102 for EMS. Pt received 1000mg  tylenol. Pt has early stages of dementia. Pt had a TB test last Wednesday and tested positive. No coughing. Pt has never had symptoms, was confirmed when getting a physical.  Pt also c/o loss of appetite over the last two weeks. A&O to baseline.

## 2016-04-01 NOTE — ED Notes (Signed)
Bed: WA04 Expected date:  Expected time:  Means of arrival:  Comments: EMS/5F/weakness

## 2016-04-01 NOTE — ED Provider Notes (Addendum)
CSN: [id number]     Arrival date & time 04/01/16  1258 History   First MD Initiated Contact with Patient 04/01/16 1452     Chief Complaint  Patient presents with  . Weakness     (Consider location/radiation/quality/duration/timing/severity/associated sxs/prior Treatment) HPI Comments: -year-old female with past medical history including hypertension, dementia, and type 2 diabetes mellitus who presents with weakness. History limited because of patient's dementia and obtained from family as well as EMS. EMS reports that the patient was picked up from her home with complaint of generalized weakness since she woke up this morning. She denies any vomiting, diarrhea, abdominal pain, shortness of breath, or chest pain. Per EMS, she had a fever of 102 for which they gave her Tylenol. Daughter reports that the patient had a TB test last Wednesday and was told the skin test was positive. She has not had any recent coughing. She has had decreased appetite over the past 2 weeks and because of decreased appetite, has had recent weight loss. No known recent fevers aside from today. She denies urinary symptoms. No international travel or exposure to homeless or incarcerated individuals, or anyone with chronic cough.  Patient is a 82 y.o. female presenting with weakness. The history is provided by a relative.  Weakness    Past Medical History  Diagnosis Date  . Diabetes mellitus without complication (HCC)   . Hypertension    Past Surgical History  Procedure Laterality Date  . Abdominal hysterectomy     No family history on file. Social History  Substance Use Topics  . Smoking status: Former Games developer  . Smokeless tobacco: None  . Alcohol Use: No   OB History    No data available     Review of Systems  Unable to perform ROS: Dementia  Neurological: Positive for weakness.      Allergies  Codeine  Home Medications   Prior to Admission medications   Medication Sig Start Date End Date  Taking? Authorizing Provider  acetaminophen (TYLENOL) 500 MG tablet Take 1,000 mg by mouth 2 (two) times daily.   Yes Historical Provider, MD  amLODipine (NORVASC) 5 MG tablet Take 5 mg by mouth daily. 05/01/15  Yes Historical Provider, MD  atorvastatin (LIPITOR) 20 MG tablet Take 20 mg by mouth daily.   Yes Historical Provider, MD  donepezil (ARICEPT) 5 MG tablet Take 5 mg by mouth at bedtime. 04/21/15  Yes Historical Provider, MD  hydrochlorothiazide (MICROZIDE) 12.5 MG capsule Take 12.5 mg by mouth daily.   Yes Historical Provider, MD  HYDROcodone-acetaminophen (NORCO/VICODIN) 5-325 MG tablet Take 1 tablet by mouth every 6 (six) hours as needed for moderate pain. 12/18/15   Bethann Berkshire, MD  HYDROcodone-acetaminophen (NORCO/VICODIN) 5-325 MG tablet Take 1 tablet by mouth every 6 (six) hours as needed for moderate pain. Patient not taking: Reported on 04/01/2016 12/18/15   Bethann Berkshire, MD  metFORMIN (GLUCOPHAGE) 500 MG tablet Take 500 mg by mouth daily with breakfast.  05/30/15  Yes Historical Provider, MD  Multiple Vitamin (MULTIVITAMIN WITH MINERALS) TABS tablet Take 1 tablet by mouth daily.   Yes Historical Provider, MD  OLANZapine (ZYPREXA) 2.5 MG tablet Take 2.5 mg by mouth at bedtime.   Yes Historical Provider, MD  omega-3 acid ethyl esters (LOVAZA) 1 g capsule Take 1 g by mouth daily.   Yes Historical Provider, MD  valsartan (DIOVAN) 160 MG tablet Take 160 mg by mouth daily. 04/18/15  Yes Historical Provider, MD   BP 163/62 mmHg  Pulse 77  Temp(Src) 98.4 F (36.9 C) (Oral)  Resp 14  SpO2 97% Physical Exam  Constitutional: She appears well-developed and well-nourished. No distress.  Frail appearing  HENT:  Head: Normocephalic and atraumatic.  Moist mucous membranes  Eyes: Conjunctivae are normal. Pupils are equal, round, and reactive to light.  Neck: Neck supple.  Cardiovascular: Normal rate and regular rhythm.   Murmur heard. 3/6 systolic murmur  Pulmonary/Chest: Effort normal.   Occasional high-pitched expiratory wheeze L lung  Abdominal: Soft. Bowel sounds are normal. She exhibits no distension. There is no tenderness.  Musculoskeletal: She exhibits no edema.  Neurological: She is alert.  Oriented to person and place but slightly confused  Skin: Skin is warm and dry.  1-1.5cm circular, raised area of erythema on mid left forearm  Psychiatric:  Calm, cooperative  Nursing note and vitals reviewed.   ED Course  Procedures (including critical care time) Labs Review Labs Reviewed  BASIC METABOLIC PANEL - Abnormal; Notable for the following:    Chloride 114 (*)    CO2 20 (*)    Glucose, Bld 114 (*)    BUN 55 (*)    Creatinine, Ser 2.14 (*)    GFR calc non Af Amer 19 (*)    GFR calc Af Amer 23 (*)    All other components within normal limits  CBC - Abnormal; Notable for the following:    RBC 2.99 (*)    Hemoglobin 8.0 (*)    HCT 25.3 (*)    All other components within normal limits  URINALYSIS, ROUTINE W REFLEX MICROSCOPIC (NOT AT [facility]) - Abnormal; Notable for the following:    Protein, ur 30 (*)    All other components within normal limits  HEPATIC FUNCTION PANEL - Abnormal; Notable for the following:    ALT 12 (*)    Total Bilirubin 0.2 (*)    Bilirubin, Direct <0.1 (*)    All other components within normal limits  BRAIN NATRIURETIC PEPTIDE - Abnormal; Notable for the following:    B Natriuretic Peptide 262.5 (*)    All other components within normal limits  URINE MICROSCOPIC-ADD ON - Abnormal; Notable for the following:    Squamous Epithelial / LPF 0-5 (*)    Bacteria, UA RARE (*)    All other components within normal limits  CBG MONITORING, ED - Abnormal; Notable for the following:    Glucose-Capillary 107 (*)    All other components within normal limits  QUANTIFERON TB GOLD ASSAY (BLOOD)  I-STAT CG4 LACTIC ACID, ED  POC OCCULT BLOOD, ED    Imaging Review Dg Chest 2 View  04/01/2016  CLINICAL DATA:  Generalized weakness and fever. EXAM:  CHEST  2 VIEW COMPARISON:  None. FINDINGS: The cardiac silhouette is mildly enlarged, and there is mild central pulmonary vascular congestion. No overt pulmonary edema, segmental airspace consolidation, pleural effusion, or pneumothorax is identified. No acute osseous abnormality is seen. IMPRESSION: Cardiomegaly and mild pulmonary vascular congestion. Electronically Signed   By: Sebastian Ache M.D.   On: 04/01/2016 15:37   I have personally reviewed and evaluated these lab results as part of my medical decision-making.   EKG Interpretation   Date/Time:  Monday April 01 2016 13:25:19 EDT Ventricular Rate:  69 PR Interval:  164 QRS Duration: 88 QT Interval:  404 QTC Calculation: 433 R Axis:   36 Text Interpretation:  Sinus rhythm Consider left ventricular hypertrophy  No significant change since last tracing Confirmed by Martese Vanatta MD, Lera Gaines  [phone]) on 04/01/2016 6:19:51  PM      MDM   Final diagnoses:  Weakness  Anemia, unspecified anemia type    Pt brought from home for report of generalized weakness that began this morning. EMS noted fever 102 for which she received tylenol. On arrival, the patient was awake and alert, comfortable and in no acute distress. Vital signs notable only for mild hypertension. Normal work of breathing and no crackles on exam, occasional expiratory wheeze on the left lung. I did see an area of raised erythema on left forearm which may be from TB test. Placed patient on airborne precautions given this report of positive TB testing without further details. Obtained above lab work as well as chest x-ray and EKG.  Lab work shows creatinine at 2.14, which is slightly worse than last year. BNP 262. Chest x-ray shows cardiomegaly and mild pulmonary vascular congestion but no acute infiltrate and no findings to suggest active tuberculosis. Given normal WBC count, no reports of coughing, and no findings on chest x-ray, I feel that active TB is very unlikely. The patient has  hemoglobin 8, similar to previous hemoglobin around 9. Hemoccult is negative. Family does state that she is on iron for anemia. I instructed them to follow-up with PCP as patient may need more aggressive anemia treatment. The daughter who called EMS later came to the ED and stated that she called EMS because patient was generally weak and not acting herself this morning. She is at her baseline currently. No fevers or recent illness. EMS reported a fever of 102 and gave her Tylenol, but transport time was only 15 minutes and the patient was afebrile and has remained afebrile here, therefore I doubt true fever. Her vital signs have remained normal and she is comfortable and requesting to go home. I have instructed to follow-up with PCP in 2-3 days for repeat evaluation as well as further workup of anemia and positive TB test. I extensively reviewed return precautions and family voiced understanding. Patient discharged in satisfactory condition.  Laurence Spatesachel Morgan Kyndle Schlender, MD 04/01/16 1802  Laurence Spatesachel Morgan Annleigh Knueppel, MD 04/01/16 [phone]

## 2016-04-01 NOTE — ED Notes (Signed)
Pt attempted to give urine sample, was unsuccessful

## 2016-04-04 LAB — QUANTIFERON IN TUBE
QFT TB AG MINUS NIL VALUE: 8.52 [IU]/mL
QUANTIFERON MITOGEN VALUE: >10 IU/mL
QUANTIFERON TB AG VALUE: 8.58 [IU]/mL
QUANTIFERON TB GOLD: POSITIVE — AB
Quantiferon Nil Value: 0.06 IU/mL

## 2016-04-04 LAB — QUANTIFERON TB GOLD ASSAY (BLOOD)

## 2016-04-30 ENCOUNTER — Encounter (HOSPITAL_COMMUNITY): Payer: Self-pay

## 2016-04-30 ENCOUNTER — Emergency Department (HOSPITAL_COMMUNITY)
Admission: EM | Admit: 2016-04-30 | Discharge: 2016-04-30 | Disposition: A | Discharge status: Home / Self Care | Payer: Medicare Other | Attending: Emergency Medicine | Admitting: Emergency Medicine

## 2016-04-30 ENCOUNTER — Emergency Department (HOSPITAL_COMMUNITY): Payer: Medicare Other

## 2016-04-30 DIAGNOSIS — E119 Type 2 diabetes mellitus without complications: Secondary | ICD-10-CM | POA: Insufficient documentation

## 2016-04-30 DIAGNOSIS — Z3202 Encounter for pregnancy test, result negative: Secondary | ICD-10-CM | POA: Diagnosis not present

## 2016-04-30 DIAGNOSIS — R600 Localized edema: Secondary | ICD-10-CM | POA: Diagnosis not present

## 2016-04-30 DIAGNOSIS — Z79899 Other long term (current) drug therapy: Secondary | ICD-10-CM | POA: Diagnosis not present

## 2016-04-30 DIAGNOSIS — Z7984 Long term (current) use of oral hypoglycemic drugs: Secondary | ICD-10-CM | POA: Diagnosis not present

## 2016-04-30 DIAGNOSIS — Z87891 Personal history of nicotine dependence: Secondary | ICD-10-CM | POA: Insufficient documentation

## 2016-04-30 DIAGNOSIS — J3489 Other specified disorders of nose and nasal sinuses: Secondary | ICD-10-CM | POA: Diagnosis not present

## 2016-04-30 DIAGNOSIS — R4182 Altered mental status, unspecified: Secondary | ICD-10-CM | POA: Diagnosis present

## 2016-04-30 DIAGNOSIS — I10 Essential (primary) hypertension: Secondary | ICD-10-CM | POA: Insufficient documentation

## 2016-04-30 DIAGNOSIS — R011 Cardiac murmur, unspecified: Secondary | ICD-10-CM | POA: Insufficient documentation

## 2016-04-30 DIAGNOSIS — R300 Dysuria: Secondary | ICD-10-CM | POA: Insufficient documentation

## 2016-04-30 DIAGNOSIS — R4789 Other speech disturbances: Secondary | ICD-10-CM | POA: Insufficient documentation

## 2016-04-30 DIAGNOSIS — D649 Anemia, unspecified: Secondary | ICD-10-CM | POA: Insufficient documentation

## 2016-04-30 LAB — URINE MICROSCOPIC-ADD ON

## 2016-04-30 LAB — URINALYSIS, ROUTINE W REFLEX MICROSCOPIC
Bilirubin Urine: NEGATIVE
Glucose, UA: NEGATIVE mg/dL
Hgb urine dipstick: NEGATIVE
Ketones, ur: NEGATIVE mg/dL
LEUKOCYTES UA: NEGATIVE
NITRITE: NEGATIVE
PROTEIN: 100 mg/dL — AB
SPECIFIC GRAVITY, URINE: 1.017 (ref 1.005–1.030)
pH: 5 (ref 5.0–8.0)

## 2016-04-30 LAB — CBC
HCT: 24.1 % — ABNORMAL LOW (ref 36.0–46.0)
HEMOGLOBIN: 7.6 g/dL — AB (ref 12.0–15.0)
MCH: 25.9 pg — AB (ref 26.0–34.0)
MCHC: 31.5 g/dL (ref 30.0–36.0)
MCV: 82 fL (ref 78.0–100.0)
PLATELETS: 243 10*3/uL (ref 150–400)
RBC: 2.94 MIL/uL — AB (ref 3.87–5.11)
RDW: 15.8 % — ABNORMAL HIGH (ref 11.5–15.5)
WBC: 12 10*3/uL — AB (ref 4.0–10.5)

## 2016-04-30 LAB — COMPREHENSIVE METABOLIC PANEL
ALK PHOS: 78 U/L (ref 38–126)
ALT: 23 U/L (ref 14–54)
ANION GAP: 13 (ref 5–15)
AST: 36 U/L (ref 15–41)
Albumin: 2.9 g/dL — ABNORMAL LOW (ref 3.5–5.0)
BUN: 45 mg/dL — ABNORMAL HIGH (ref 6–20)
CALCIUM: 9.5 mg/dL (ref 8.9–10.3)
CO2: 19 mmol/L — AB (ref 22–32)
CREATININE: 1.79 mg/dL — AB (ref 0.44–1.00)
Chloride: 111 mmol/L (ref 101–111)
GFR, EST AFRICAN AMERICAN: 28 mL/min — AB (ref >60)
GFR, EST NON AFRICAN AMERICAN: 24 mL/min — AB (ref >60)
Glucose, Bld: 137 mg/dL — ABNORMAL HIGH (ref 65–99)
Potassium: 4.1 mmol/L (ref 3.5–5.1)
SODIUM: 143 mmol/L (ref 135–145)
TOTAL PROTEIN: 6.6 g/dL (ref 6.5–8.1)
Total Bilirubin: 0.4 mg/dL (ref 0.3–1.2)

## 2016-04-30 LAB — POC OCCULT BLOOD, ED: Fecal Occult Bld: NEGATIVE

## 2016-04-30 NOTE — ED Notes (Signed)
[facility].  Pt has hx of dementia but facility reports pt baseline is verbal with occasional recognition of staff members.  Pt is oriented to self but will only answer with prompting.

## 2016-04-30 NOTE — ED Notes (Signed)
[facility] called [facility] and [facility], per Cinnamon LakeErin, Charity fundraiserN.

## 2016-04-30 NOTE — Discharge Instructions (Signed)
We checked a head CT and labs. Head CT did not show any new changes. Urine test did not suggest an infection. Blood testing was significant for low hemoglobin but this appears chronic. No blood found in stool. Please follow-up with your primary care physician. If symptoms worsen, please follow-up in the emergency department.

## 2016-04-30 NOTE — ED Provider Notes (Signed)
CSN: [id number]     Arrival date & time 04/30/16  1309 History   First MD Initiated Contact with Patient 04/30/16 1321     Chief Complaint  Patient presents with  . Altered Mental Status     (Consider location/radiation/quality/duration/timing/severity/associated sxs/prior Treatment) HPI  Patient presents from [facility] with report that she has had altered mental status for two days and foul urine smell. Speaking with daughters, patient has not been acting differently over the last few days but has had cognitive decline for the last few weeks in addition to physical decline. Patient reports no dysuria, although daughters state that ALF said she was complaining of dysuria.   Past Medical History  Diagnosis Date  . Diabetes mellitus without complication (HCC)   . Hypertension    Past Surgical History  Procedure Laterality Date  . Abdominal hysterectomy     History reviewed. No pertinent family history. Social History  Substance Use Topics  . Smoking status: Former Games developer  . Smokeless tobacco: None  . Alcohol Use: No   OB History    No data available     Review of Systems  Constitutional: Negative for fever.  HENT: Positive for congestion and rhinorrhea. Negative for sneezing.   Cardiovascular: Negative for chest pain.  Gastrointestinal: Negative for abdominal pain.  Genitourinary: Positive for dysuria.  Skin: Negative for wound.  Neurological: Positive for speech difficulty and weakness.  All other systems reviewed and are negative.     Allergies  Codeine  Home Medications   Prior to Admission medications   Medication Sig Start Date End Date Taking? Authorizing Provider  acetaminophen (TYLENOL) 500 MG tablet Take 1,000 mg by mouth 2 (two) times daily.    Historical Provider, MD  amLODipine (NORVASC) 5 MG tablet Take 5 mg by mouth daily. 05/01/15   Historical Provider, MD  atorvastatin (LIPITOR) 20 MG tablet Take 20 mg by mouth daily.    Historical Provider,  MD  donepezil (ARICEPT) 5 MG tablet Take 5 mg by mouth at bedtime. 04/21/15   Historical Provider, MD  hydrochlorothiazide (MICROZIDE) 12.5 MG capsule Take 12.5 mg by mouth daily.    Historical Provider, MD  HYDROcodone-acetaminophen (NORCO/VICODIN) 5-325 MG tablet Take 1 tablet by mouth every 6 (six) hours as needed for moderate pain. 12/18/15   Bethann Berkshire, MD  HYDROcodone-acetaminophen (NORCO/VICODIN) 5-325 MG tablet Take 1 tablet by mouth every 6 (six) hours as needed for moderate pain. Patient not taking: Reported on 04/01/2016 12/18/15   Bethann Berkshire, MD  metFORMIN (GLUCOPHAGE) 500 MG tablet Take 500 mg by mouth daily with breakfast.  05/30/15   Historical Provider, MD  Multiple Vitamin (MULTIVITAMIN WITH MINERALS) TABS tablet Take 1 tablet by mouth daily.    Historical Provider, MD  OLANZapine (ZYPREXA) 2.5 MG tablet Take 2.5 mg by mouth at bedtime.    Historical Provider, MD  omega-3 acid ethyl esters (LOVAZA) 1 g capsule Take 1 g by mouth daily.    Historical Provider, MD  valsartan (DIOVAN) 160 MG tablet Take 160 mg by mouth daily. 04/18/15   Historical Provider, MD   BP 149/85 mmHg  Pulse 90  Temp(Src) 99.6 F (37.6 C) (Rectal)  Resp 17  Ht 5\' 5"  (1.651 m)  Wt 68.04 kg  BMI 24.96 kg/m2  SpO2 100% Physical Exam  Constitutional: She appears well-developed and well-nourished.  HENT:  Head: Normocephalic and atraumatic.  Eyes: Conjunctivae and EOM are normal.  Neck: Normal range of motion.  Cardiovascular: Normal rate, regular rhythm  and intact distal pulses.   Murmur (radiates to axilla) heard.  Systolic murmur is present with a grade of 2/6  Pulmonary/Chest: Effort normal. No accessory muscle usage. No tachypnea. No respiratory distress. She has decreased breath sounds. She has no wheezes. She has no rhonchi. She has no rales.  Abdominal: Soft. Bowel sounds are normal. She exhibits no distension. There is no tenderness. There is no rebound.  Musculoskeletal: Normal range of  motion. She exhibits edema (trace). She exhibits no tenderness.  Lymphadenopathy:    She has no cervical adenopathy.  Neurological: She is alert. She is disoriented (Patient oriented to person. She knows she is at a hospital but did not know which hospital. She thought the year was [id number]. She could recognize her family.). Cranial nerve deficit: Patient appears to have some mild right facial droop.  Follows commands. Motor strength 4/5  Skin: Skin is warm.  Psychiatric: Cognition and memory are impaired.    ED Course  Procedures (including critical care time) Labs Review Labs Reviewed  COMPREHENSIVE METABOLIC PANEL - Abnormal; Notable for the following:    CO2 19 (*)    Glucose, Bld 137 (*)    BUN 45 (*)    Creatinine, Ser 1.79 (*)    Albumin 2.9 (*)    GFR calc non Af Amer 24 (*)    GFR calc Af Amer 28 (*)    All other components within normal limits  CBC - Abnormal; Notable for the following:    WBC 12.0 (*)    RBC 2.94 (*)    Hemoglobin 7.6 (*)    HCT 24.1 (*)    MCH 25.9 (*)    RDW 15.8 (*)    All other components within normal limits  URINALYSIS, ROUTINE W REFLEX MICROSCOPIC (NOT AT [facility]) - Abnormal; Notable for the following:    Protein, ur 100 (*)    All other components within normal limits  URINE MICROSCOPIC-ADD ON - Abnormal; Notable for the following:    Squamous Epithelial / LPF 0-5 (*)    Bacteria, UA RARE (*)    Casts HYALINE CASTS (*)    All other components within normal limits  CBG MONITORING, ED  POC OCCULT BLOOD, ED    Imaging Review Dg Chest 2 View  04/30/2016  CLINICAL DATA:  Altered mental status.  History of dementia. EXAM: CHEST  2 VIEW COMPARISON:  04/01/2016. FINDINGS: The cardiac silhouette remains mildly enlarged. Normal vascularity. Clear lungs with minimal diffuse peribronchial thickening. Diffuse osteopenia. Bilateral shoulder degenerative changes and superior migration of the humeral heads, compatible chronic rotator cuff tears. Thoracic spine  degenerative changes. IMPRESSION: 1. No acute abnormality. 2. Stable mild cardiomegaly. 3. Minimal chronic bronchitic changes. Electronically Signed   By: Beckie SaltsSteven  Reid M.D.   On: 04/30/2016 14:37   Ct Head Wo Contrast  04/30/2016  CLINICAL DATA:  Altered mental status and history of dementia. EXAM: CT HEAD WITHOUT CONTRAST TECHNIQUE: Contiguous axial images were obtained from the base of the skull through the vertex without intravenous contrast. COMPARISON:  07/04/2015. FINDINGS: Stable advanced small vessel ischemic changes in the periventricular white matter. The brain demonstrates no evidence of hemorrhage, infarction, edema, mass effect, extra-axial fluid collection, hydrocephalus or mass lesion. The skull is unremarkable and shows no evidence of fracture. IMPRESSION: Stable chronic advanced small vessel ischemic disease. No acute findings. Electronically Signed   By: Irish LackGlenn  Yamagata M.D.   On: 04/30/2016 15:00   I have personally reviewed and evaluated these images and lab  results as part of my medical decision-making.   EKG Interpretation   Date/Time:  Tuesday Apr 30 2016 13:18:00 EDT Ventricular Rate:  92 PR Interval:  146 QRS Duration: 87 QT Interval:  357 QTC Calculation: 442 R Axis:   56 Text Interpretation:  Sinus rhythm Probable left atrial enlargement  Abnormal R-wave progression, early transition Probable left ventricular  hypertrophy Confirmed by [facility] MD, Barbara Cower [phone]) on 04/30/2016 1:39:42 PM     Will obtain CT head since report from ALF is AMS. Patient higher risk with age.    MDM   Final diagnoses:  Anemia, unspecified anemia type   Patient with confusing presentation. ALF report suggesting possible UTI vs AMS. Family reports mental status has been unchanged for at least 2-3 weeks. Patient's dementia appears to be significant as she is totally dependent with ADLs with continued, steady decline. Urinalysis not suggestive of a UTI. Blood sugar elevated slightly, CBC  significant for slightly lower hemoglobin with negative FOBT. CXR unremarkable and CT head significant for chronic ischemic disease. Patient reassessed before discharge with unchanged neuro exam. Discussed with family and agree with discharge, although would like to take her home instead of back to her ALF. Questions answered. Patient stable for discharge.   Narda Bonds, MD 04/30/16 1605  Marily Memos, MD 05/01/16 1100

## 2016-05-01 LAB — POC URINE PREG, ED: Preg Test, Ur: NEGATIVE

## 2016-05-10 ENCOUNTER — Encounter (HOSPITAL_COMMUNITY): Payer: Self-pay | Admitting: Emergency Medicine

## 2016-05-10 ENCOUNTER — Inpatient Hospital Stay (HOSPITAL_COMMUNITY)
Admission: EM | Admit: 2016-05-10 | Discharge: 2016-05-14 | DRG: 682 | Disposition: A | Discharge status: Skilled Nursing Facility | Payer: Medicare Other | Attending: Internal Medicine | Admitting: Internal Medicine

## 2016-05-10 DIAGNOSIS — Z66 Do not resuscitate: Secondary | ICD-10-CM | POA: Diagnosis present

## 2016-05-10 DIAGNOSIS — E785 Hyperlipidemia, unspecified: Secondary | ICD-10-CM | POA: Diagnosis present

## 2016-05-10 DIAGNOSIS — N179 Acute kidney failure, unspecified: Secondary | ICD-10-CM | POA: Diagnosis present

## 2016-05-10 DIAGNOSIS — E872 Acidosis: Secondary | ICD-10-CM | POA: Diagnosis present

## 2016-05-10 DIAGNOSIS — G92 Toxic encephalopathy: Secondary | ICD-10-CM | POA: Diagnosis present

## 2016-05-10 DIAGNOSIS — I129 Hypertensive chronic kidney disease with stage 1 through stage 4 chronic kidney disease, or unspecified chronic kidney disease: Secondary | ICD-10-CM | POA: Diagnosis present

## 2016-05-10 DIAGNOSIS — Z87891 Personal history of nicotine dependence: Secondary | ICD-10-CM

## 2016-05-10 DIAGNOSIS — R109 Unspecified abdominal pain: Secondary | ICD-10-CM

## 2016-05-10 DIAGNOSIS — G9340 Encephalopathy, unspecified: Secondary | ICD-10-CM

## 2016-05-10 DIAGNOSIS — Z7984 Long term (current) use of oral hypoglycemic drugs: Secondary | ICD-10-CM

## 2016-05-10 DIAGNOSIS — D649 Anemia, unspecified: Secondary | ICD-10-CM | POA: Diagnosis present

## 2016-05-10 DIAGNOSIS — E860 Dehydration: Secondary | ICD-10-CM | POA: Diagnosis present

## 2016-05-10 DIAGNOSIS — R296 Repeated falls: Secondary | ICD-10-CM | POA: Diagnosis present

## 2016-05-10 DIAGNOSIS — N289 Disorder of kidney and ureter, unspecified: Secondary | ICD-10-CM | POA: Insufficient documentation

## 2016-05-10 DIAGNOSIS — Z79899 Other long term (current) drug therapy: Secondary | ICD-10-CM

## 2016-05-10 DIAGNOSIS — I10 Essential (primary) hypertension: Secondary | ICD-10-CM | POA: Diagnosis present

## 2016-05-10 DIAGNOSIS — N184 Chronic kidney disease, stage 4 (severe): Secondary | ICD-10-CM | POA: Diagnosis present

## 2016-05-10 DIAGNOSIS — O26899 Other specified pregnancy related conditions, unspecified trimester: Secondary | ICD-10-CM

## 2016-05-10 DIAGNOSIS — N390 Urinary tract infection, site not specified: Secondary | ICD-10-CM | POA: Diagnosis present

## 2016-05-10 DIAGNOSIS — K5900 Constipation, unspecified: Secondary | ICD-10-CM | POA: Diagnosis present

## 2016-05-10 DIAGNOSIS — E1122 Type 2 diabetes mellitus with diabetic chronic kidney disease: Secondary | ICD-10-CM | POA: Diagnosis present

## 2016-05-10 DIAGNOSIS — R5383 Other fatigue: Secondary | ICD-10-CM | POA: Diagnosis not present

## 2016-05-10 DIAGNOSIS — F0390 Unspecified dementia without behavioral disturbance: Secondary | ICD-10-CM | POA: Diagnosis present

## 2016-05-10 DIAGNOSIS — Z515 Encounter for palliative care: Secondary | ICD-10-CM | POA: Insufficient documentation

## 2016-05-10 DIAGNOSIS — E871 Hypo-osmolality and hyponatremia: Secondary | ICD-10-CM | POA: Diagnosis present

## 2016-05-10 DIAGNOSIS — N189 Chronic kidney disease, unspecified: Secondary | ICD-10-CM

## 2016-05-10 LAB — URINALYSIS, ROUTINE W REFLEX MICROSCOPIC
Bilirubin Urine: NEGATIVE
Glucose, UA: NEGATIVE mg/dL
Hgb urine dipstick: NEGATIVE
KETONES UR: NEGATIVE mg/dL
NITRITE: NEGATIVE
PROTEIN: NEGATIVE mg/dL
Specific Gravity, Urine: 1.019 (ref 1.005–1.030)
pH: 5 (ref 5.0–8.0)

## 2016-05-10 LAB — CBC WITH DIFFERENTIAL/PLATELET
BASOS ABS: 0 10*3/uL (ref 0.0–0.1)
Basophils Relative: 0 %
Eosinophils Absolute: 0.1 10*3/uL (ref 0.0–0.7)
Eosinophils Relative: 1 %
HEMATOCRIT: 21.1 % — AB (ref 36.0–46.0)
HEMOGLOBIN: 6.5 g/dL — AB (ref 12.0–15.0)
LYMPHS PCT: 16 %
Lymphs Abs: 1.8 10*3/uL (ref 0.7–4.0)
MCH: 25.2 pg — ABNORMAL LOW (ref 26.0–34.0)
MCHC: 30.8 g/dL (ref 30.0–36.0)
MCV: 81.8 fL (ref 78.0–100.0)
Monocytes Absolute: 0.7 10*3/uL (ref 0.1–1.0)
Monocytes Relative: 6 %
NEUTROS PCT: 77 %
Neutro Abs: 8.9 10*3/uL — ABNORMAL HIGH (ref 1.7–7.7)
Platelets: 216 10*3/uL (ref 150–400)
RBC: 2.58 MIL/uL — ABNORMAL LOW (ref 3.87–5.11)
RDW: 16.2 % — ABNORMAL HIGH (ref 11.5–15.5)
WBC: 11.5 10*3/uL — ABNORMAL HIGH (ref 4.0–10.5)

## 2016-05-10 LAB — COMPREHENSIVE METABOLIC PANEL
ALK PHOS: 70 U/L (ref 38–126)
ALT: 27 U/L (ref 14–54)
ANION GAP: 11 (ref 5–15)
AST: 38 U/L (ref 15–41)
Albumin: 2.2 g/dL — ABNORMAL LOW (ref 3.5–5.0)
BILIRUBIN TOTAL: 0.5 mg/dL (ref 0.3–1.2)
BUN: 103 mg/dL — ABNORMAL HIGH (ref 6–20)
CALCIUM: 8.8 mg/dL — AB (ref 8.9–10.3)
CO2: 19 mmol/L — ABNORMAL LOW (ref 22–32)
Chloride: 116 mmol/L — ABNORMAL HIGH (ref 101–111)
Creatinine, Ser: 2.71 mg/dL — ABNORMAL HIGH (ref 0.44–1.00)
GFR calc non Af Amer: 15 mL/min — ABNORMAL LOW (ref >60)
GFR, EST AFRICAN AMERICAN: 17 mL/min — AB (ref >60)
Glucose, Bld: 149 mg/dL — ABNORMAL HIGH (ref 65–99)
Potassium: 4.5 mmol/L (ref 3.5–5.1)
Sodium: 146 mmol/L — ABNORMAL HIGH (ref 135–145)
TOTAL PROTEIN: 5.8 g/dL — AB (ref 6.5–8.1)

## 2016-05-10 LAB — URINE MICROSCOPIC-ADD ON: RBC / HPF: NONE SEEN RBC/hpf (ref 0–5)

## 2016-05-10 LAB — I-STAT CG4 LACTIC ACID, ED: LACTIC ACID, VENOUS: 0.73 mmol/L (ref 0.5–2.0)

## 2016-05-10 LAB — PREPARE RBC (CROSSMATCH)

## 2016-05-10 MED ORDER — SODIUM CHLORIDE 0.9 % IV SOLN: INTRAVENOUS | Status: DC

## 2016-05-10 MED ORDER — DEXTROSE 5 % IV SOLN
1.0000 g | Freq: Once | INTRAVENOUS | Status: AC
  Administered 2016-05-10: 1 g via INTRAVENOUS
  Filled 2016-05-10: qty 10

## 2016-05-10 NOTE — ED Provider Notes (Signed)
CSN: [id number]     Arrival date & time 05/10/16  1803 History   First MD Initiated Contact with Patient 05/10/16 1822     Chief Complaint  Patient presents with  . Altered Mental Status     (Consider location/radiation/quality/duration/timing/severity/associated sxs/prior Treatment) HPI Patient's had decreased activity level. Nursing staff is noted change in mental status. Family members report that at baseline she is verbally interactive but that has been decreasing. She is placed in nursing home care 3 weeks ago due to frequent falls at her home. She's been spending more time in bed and slumped over at times. There is been no documented fever. There's been no vomiting.Family Members do report she continues to take it food and drink orally. Patient does not provide additional history. Past Medical History  Diagnosis Date  . Diabetes mellitus without complication (HCC)   . Hypertension    Past Surgical History  Procedure Laterality Date  . Abdominal hysterectomy     No family history on file. Social History  Substance Use Topics  . Smoking status: Former Games developermoker  . Smokeless tobacco: None  . Alcohol Use: No   OB History    No data available     Review of Systems Cannot perform review of systems due to patient dementia level V caveat.   Allergies  Codeine  Home Medications   Prior to Admission medications   Medication Sig Start Date End Date Taking? Authorizing Provider  acetaminophen (TYLENOL) 325 MG tablet Take 650 mg by mouth 2 (two) times daily.   Yes Historical Provider, MD  amLODipine (NORVASC) 5 MG tablet Take 5 mg by mouth daily. 05/01/15  Yes Historical Provider, MD  atorvastatin (LIPITOR) 20 MG tablet Take 20 mg by mouth daily.   Yes Historical Provider, MD  donepezil (ARICEPT) 5 MG tablet Take 10 mg by mouth at bedtime.  04/21/15  Yes Historical Provider, MD  hydrochlorothiazide (MICROZIDE) 12.5 MG capsule Take 12.5 mg by mouth daily.   Yes Historical Provider,  MD  metFORMIN (GLUCOPHAGE) 500 MG tablet Take 500 mg by mouth daily with breakfast.  05/30/15  Yes Historical Provider, MD  Multiple Vitamin (MULTIVITAMIN WITH MINERALS) TABS tablet Take 1 tablet by mouth daily.   Yes Historical Provider, MD  OLANZapine (ZYPREXA) 2.5 MG tablet Take 2.5 mg by mouth at bedtime.   Yes Historical Provider, MD  omega-3 acid ethyl esters (LOVAZA) 1 g capsule Take 1 g by mouth daily.   Yes Historical Provider, MD  risperiDONE (RISPERDAL) 0.25 MG tablet Take 0.25 mg by mouth at bedtime.   Yes Historical Provider, MD  HYDROcodone-acetaminophen (NORCO/VICODIN) 5-325 MG tablet Take 1 tablet by mouth every 6 (six) hours as needed for moderate pain. Patient not taking: Reported on 05/10/2016 12/18/15   Bethann BerkshireJoseph Zammit, MD  HYDROcodone-acetaminophen (NORCO/VICODIN) 5-325 MG tablet Take 1 tablet by mouth every 6 (six) hours as needed for moderate pain. Patient not taking: Reported on 04/01/2016 12/18/15   Bethann BerkshireJoseph Zammit, MD   BP 137/94 mmHg  Pulse 85  Temp(Src) 98.5 F (36.9 C) (Oral)  Resp 22  Ht 5' (1.524 m)  Wt 120 lb (54.432 kg)  BMI 23.44 kg/m2  SpO2 100% Physical Exam  Constitutional: She appears well-developed and well-nourished.  Patient is awake and responsive. No respiratory distress.  HENT:  Head: Normocephalic and atraumatic.  Right Ear: External ear normal.  Left Ear: External ear normal.  Nose: Nose normal.  Mouth/Throat: Oropharynx is clear and moist.  Eyes: EOM are normal. Pupils are  equal, round, and reactive to light.  Neck: Neck supple.  Cardiovascular: Normal rate, regular rhythm, normal heart sounds and intact distal pulses.   Pulmonary/Chest: Effort normal and breath sounds normal.  Abdominal: Soft. She exhibits no distension. There is tenderness.  Patient diffusely endorses tenderness without localization. No guarding.  Genitourinary:  Rectal exam: Patient has large amount of soft stool in her diaper. Digital examination is for rectal vault  filled with soft, brown stool. No melena. Patient has one, larger fissure in the 12:00 position. No bleeding.  Musculoskeletal: She exhibits no edema or tenderness.  Patient with limited range of motion of her legs. Her knees are fairly stiff. Much flexibility of the hips. No deformity or effusions present.  Neurological: She is alert. She exhibits normal muscle tone. Coordination normal.  Skin: Skin is warm and dry.  Psychiatric: She has a normal mood and affect.    ED Course  Procedures (including critical care time) Labs Review Labs Reviewed  URINALYSIS, ROUTINE W REFLEX MICROSCOPIC (NOT AT [facility]) - Abnormal; Notable for the following:    APPearance HAZY (*)    Leukocytes, UA LARGE (*)    All other components within normal limits  URINE MICROSCOPIC-ADD ON - Abnormal; Notable for the following:    Squamous Epithelial / LPF 0-5 (*)    Bacteria, UA FEW (*)    Casts HYALINE CASTS (*)    All other components within normal limits  COMPREHENSIVE METABOLIC PANEL - Abnormal; Notable for the following:    Sodium 146 (*)    Chloride 116 (*)    CO2 19 (*)    Glucose, Bld 149 (*)    BUN 103 (*)    Creatinine, Ser 2.71 (*)    Calcium 8.8 (*)    Total Protein 5.8 (*)    Albumin 2.2 (*)    GFR calc non Af Amer 15 (*)    GFR calc Af Amer 17 (*)    All other components within normal limits  CBC WITH DIFFERENTIAL/PLATELET - Abnormal; Notable for the following:    WBC 11.5 (*)    RBC 2.58 (*)    Hemoglobin 6.5 (*)    HCT 21.1 (*)    MCH 25.2 (*)    RDW 16.2 (*)    All other components within normal limits  CULTURE, BLOOD (ROUTINE X 2)  CULTURE, BLOOD (ROUTINE X 2)  URINE CULTURE  I-STAT CG4 LACTIC ACID, ED  POC OCCULT BLOOD, ED  PREPARE RBC (CROSSMATCH)  TYPE AND SCREEN    Imaging Review No results found. I have personally reviewed and evaluated these images and lab results as part of my medical decision-making.   EKG Interpretation   Date/Time:  Friday May 10 2016 18:23:08  EDT Ventricular Rate:  86 PR Interval:  147 QRS Duration: 85 QT Interval:  368 QTC Calculation: 440 R Axis:   57 Text Interpretation:  Sinus rhythm Left ventricular hypertrophy Confirmed  by Donnald Garre, MD, Lebron Conners [phone]) on 05/10/2016 7:48:17 PM     Consult: Jamal Maes hospitalist for admission MDM   Final diagnoses:  UTI (lower urinary tract infection)  Anemia, unspecified anemia type  Acute renal insufficiency   Patient presents with reported altered mental status and decreased activity. Identified etiologies are UTI and anemia. Patient does not have evidence of acute GI bleed. Her vital signs are stable. At this time she will be admitted for ongoing treatment of acute kidney injury with anemia and UTI. Mental status is awake and alert. Patient answers response  to simple questions. General condition is for no acute distress.    Arby Barrette, MD 05/10/16 [phone]

## 2016-05-10 NOTE — ED Notes (Signed)
Per [facility]   [facility]  AMS Lethargic for the last 3 day. Not talking to them today.   Home Health Nurse took BP 116/84  Orthostatics "84/60s" MD informed to send the pt out. Drinking and eating less. PMH Alzheimer and Demtenia 100 cc KVO Originally responsive to painful stimuli 18 LFA  118 palpated. 180 CBG

## 2016-05-10 NOTE — ED Notes (Signed)
Critical lab value called from lab.  Dr. Broadus JohnPfieffer made aware

## 2016-05-10 NOTE — ED Notes (Signed)
Family member to nurses' station requesting update on pt status and "what the plan is....We've been here since 5:30, and I just want to know what's going on". Advised family member that nurse has stepped away to take care of another pt, but this RN will notify them of family's questions". States understanding.

## 2016-05-11 ENCOUNTER — Observation Stay (HOSPITAL_COMMUNITY): Payer: Medicare Other

## 2016-05-11 ENCOUNTER — Encounter (HOSPITAL_COMMUNITY): Payer: Self-pay | Admitting: Internal Medicine

## 2016-05-11 DIAGNOSIS — N289 Disorder of kidney and ureter, unspecified: Secondary | ICD-10-CM

## 2016-05-11 DIAGNOSIS — E1122 Type 2 diabetes mellitus with diabetic chronic kidney disease: Secondary | ICD-10-CM | POA: Diagnosis present

## 2016-05-11 DIAGNOSIS — N179 Acute kidney failure, unspecified: Secondary | ICD-10-CM | POA: Diagnosis present

## 2016-05-11 DIAGNOSIS — K5900 Constipation, unspecified: Secondary | ICD-10-CM | POA: Diagnosis present

## 2016-05-11 DIAGNOSIS — N390 Urinary tract infection, site not specified: Secondary | ICD-10-CM | POA: Diagnosis not present

## 2016-05-11 DIAGNOSIS — E860 Dehydration: Secondary | ICD-10-CM | POA: Diagnosis present

## 2016-05-11 DIAGNOSIS — G9340 Encephalopathy, unspecified: Secondary | ICD-10-CM | POA: Diagnosis not present

## 2016-05-11 DIAGNOSIS — Z79899 Other long term (current) drug therapy: Secondary | ICD-10-CM | POA: Diagnosis not present

## 2016-05-11 DIAGNOSIS — Z515 Encounter for palliative care: Secondary | ICD-10-CM | POA: Diagnosis not present

## 2016-05-11 DIAGNOSIS — Z66 Do not resuscitate: Secondary | ICD-10-CM | POA: Diagnosis present

## 2016-05-11 DIAGNOSIS — D649 Anemia, unspecified: Secondary | ICD-10-CM

## 2016-05-11 DIAGNOSIS — R5383 Other fatigue: Secondary | ICD-10-CM | POA: Diagnosis present

## 2016-05-11 DIAGNOSIS — E872 Acidosis: Secondary | ICD-10-CM | POA: Diagnosis present

## 2016-05-11 DIAGNOSIS — I129 Hypertensive chronic kidney disease with stage 1 through stage 4 chronic kidney disease, or unspecified chronic kidney disease: Secondary | ICD-10-CM | POA: Diagnosis present

## 2016-05-11 DIAGNOSIS — N184 Chronic kidney disease, stage 4 (severe): Secondary | ICD-10-CM | POA: Diagnosis present

## 2016-05-11 DIAGNOSIS — G92 Toxic encephalopathy: Secondary | ICD-10-CM | POA: Diagnosis present

## 2016-05-11 DIAGNOSIS — N189 Chronic kidney disease, unspecified: Secondary | ICD-10-CM

## 2016-05-11 DIAGNOSIS — F0390 Unspecified dementia without behavioral disturbance: Secondary | ICD-10-CM | POA: Diagnosis present

## 2016-05-11 DIAGNOSIS — Z87891 Personal history of nicotine dependence: Secondary | ICD-10-CM | POA: Diagnosis not present

## 2016-05-11 DIAGNOSIS — R296 Repeated falls: Secondary | ICD-10-CM | POA: Diagnosis present

## 2016-05-11 DIAGNOSIS — I10 Essential (primary) hypertension: Secondary | ICD-10-CM | POA: Diagnosis present

## 2016-05-11 DIAGNOSIS — E871 Hypo-osmolality and hyponatremia: Secondary | ICD-10-CM | POA: Diagnosis present

## 2016-05-11 DIAGNOSIS — E785 Hyperlipidemia, unspecified: Secondary | ICD-10-CM | POA: Diagnosis present

## 2016-05-11 DIAGNOSIS — Z7984 Long term (current) use of oral hypoglycemic drugs: Secondary | ICD-10-CM | POA: Diagnosis not present

## 2016-05-11 LAB — COMPREHENSIVE METABOLIC PANEL
ALK PHOS: 75 U/L (ref 38–126)
ALT: 27 U/L (ref 14–54)
ANION GAP: 12 (ref 5–15)
AST: 38 U/L (ref 15–41)
Albumin: 2.3 g/dL — ABNORMAL LOW (ref 3.5–5.0)
BILIRUBIN TOTAL: 0.8 mg/dL (ref 0.3–1.2)
BUN: 92 mg/dL — ABNORMAL HIGH (ref 6–20)
CALCIUM: 9 mg/dL (ref 8.9–10.3)
CO2: 18 mmol/L — ABNORMAL LOW (ref 22–32)
Chloride: 117 mmol/L — ABNORMAL HIGH (ref 101–111)
Creatinine, Ser: 2.25 mg/dL — ABNORMAL HIGH (ref 0.44–1.00)
GFR, EST AFRICAN AMERICAN: 21 mL/min — AB (ref >60)
GFR, EST NON AFRICAN AMERICAN: 18 mL/min — AB (ref >60)
Glucose, Bld: 136 mg/dL — ABNORMAL HIGH (ref 65–99)
Potassium: 4.9 mmol/L (ref 3.5–5.1)
Sodium: 147 mmol/L — ABNORMAL HIGH (ref 135–145)
TOTAL PROTEIN: 6.5 g/dL (ref 6.5–8.1)

## 2016-05-11 LAB — RETICULOCYTES
RBC.: 3.75 MIL/uL — ABNORMAL LOW (ref 3.87–5.11)
RETIC CT PCT: 1.5 % (ref 0.4–3.1)
Retic Count, Absolute: 56.3 10*3/uL (ref 19.0–186.0)

## 2016-05-11 LAB — GLUCOSE, CAPILLARY
GLUCOSE-CAPILLARY: 127 mg/dL — AB (ref 65–99)
GLUCOSE-CAPILLARY: 111 mg/dL — AB (ref 65–99)
GLUCOSE-CAPILLARY: 142 mg/dL — AB (ref 65–99)
GLUCOSE-CAPILLARY: 122 mg/dL — AB (ref 65–99)
Glucose-Capillary: 139 mg/dL — ABNORMAL HIGH (ref 65–99)

## 2016-05-11 LAB — IRON AND TIBC
Iron: 48 ug/dL (ref 28–170)
SATURATION RATIOS: 23 % (ref 10.4–31.8)
TIBC: 207 ug/dL — AB (ref 250–450)
UIBC: 159 ug/dL

## 2016-05-11 LAB — AMMONIA: AMMONIA: 32 umol/L (ref 9–35)

## 2016-05-11 LAB — VITAMIN B12: VITAMIN B 12: 679 pg/mL (ref 180–914)

## 2016-05-11 LAB — MRSA PCR SCREENING: MRSA BY PCR: NEGATIVE

## 2016-05-11 LAB — CBC
HEMATOCRIT: 31.2 % — AB (ref 36.0–46.0)
HEMOGLOBIN: 10 g/dL — AB (ref 12.0–15.0)
MCH: 26.6 pg (ref 26.0–34.0)
MCHC: 32.1 g/dL (ref 30.0–36.0)
MCV: 83 fL (ref 78.0–100.0)
Platelets: 237 10*3/uL (ref 150–400)
RBC: 3.76 MIL/uL — ABNORMAL LOW (ref 3.87–5.11)
RDW: 15.4 % (ref 11.5–15.5)
WBC: 11.9 10*3/uL — ABNORMAL HIGH (ref 4.0–10.5)

## 2016-05-11 LAB — ABO/RH: ABO/RH(D): O POS

## 2016-05-11 LAB — FOLATE: Folate: 31 ng/mL (ref >5.9)

## 2016-05-11 LAB — POC OCCULT BLOOD, ED: FECAL OCCULT BLD: NEGATIVE

## 2016-05-11 LAB — FERRITIN: FERRITIN: 141 ng/mL (ref 11–307)

## 2016-05-11 MED ORDER — ACETAMINOPHEN 500 MG PO TABS
500.0000 mg | ORAL_TABLET | Freq: Four times a day (QID) | ORAL | Status: DC | PRN
  Administered 2016-05-11: 500 mg via ORAL
  Filled 2016-05-11: qty 1

## 2016-05-11 MED ORDER — DEXTROSE 5 % IV SOLN
INTRAVENOUS | Status: AC
  Administered 2016-05-11 – 2016-05-12 (×2): via INTRAVENOUS

## 2016-05-11 MED ORDER — AMLODIPINE BESYLATE 5 MG PO TABS
5.0000 mg | ORAL_TABLET | Freq: Every day | ORAL | Status: DC
  Administered 2016-05-11 – 2016-05-13 (×3): 5 mg via ORAL
  Filled 2016-05-11 (×4): qty 1

## 2016-05-11 MED ORDER — OLANZAPINE 5 MG PO TABS
5.0000 mg | ORAL_TABLET | Freq: Every day | ORAL | Status: DC
  Filled 2016-05-11: qty 1

## 2016-05-11 MED ORDER — ERYTHROMYCIN 5 MG/GM OP OINT
TOPICAL_OINTMENT | Freq: Four times a day (QID) | OPHTHALMIC | Status: DC
Start: 2016-05-11 — End: 2016-05-14
  Administered 2016-05-11 – 2016-05-14 (×10): via OPHTHALMIC
  Filled 2016-05-11: qty 3.5

## 2016-05-11 MED ORDER — OLANZAPINE 5 MG PO TABS
5.0000 mg | ORAL_TABLET | Freq: Every day | ORAL | Status: DC
  Administered 2016-05-11 – 2016-05-13 (×3): 5 mg via ORAL
  Filled 2016-05-11 (×3): qty 1

## 2016-05-11 MED ORDER — OMEGA-3-ACID ETHYL ESTERS 1 G PO CAPS
1.0000 g | ORAL_CAPSULE | Freq: Every day | ORAL | Status: DC
  Administered 2016-05-11 – 2016-05-13 (×2): 1 g via ORAL
  Filled 2016-05-11 (×4): qty 1

## 2016-05-11 MED ORDER — OLANZAPINE 2.5 MG PO TABS
2.5000 mg | ORAL_TABLET | Freq: Every day | ORAL | Status: DC
  Filled 2016-05-11 (×2): qty 1

## 2016-05-11 MED ORDER — ACETAMINOPHEN 500 MG PO TABS
500.0000 mg | ORAL_TABLET | Freq: Two times a day (BID) | ORAL | Status: DC
  Administered 2016-05-11 – 2016-05-13 (×5): 500 mg via ORAL
  Filled 2016-05-11 (×6): qty 1

## 2016-05-11 MED ORDER — ATORVASTATIN CALCIUM 20 MG PO TABS
20.0000 mg | ORAL_TABLET | Freq: Every day | ORAL | Status: DC
  Administered 2016-05-11 – 2016-05-13 (×3): 20 mg via ORAL
  Filled 2016-05-11 (×4): qty 1

## 2016-05-11 MED ORDER — CARVEDILOL 3.125 MG PO TABS
3.1250 mg | ORAL_TABLET | Freq: Two times a day (BID) | ORAL | Status: DC
  Administered 2016-05-11 – 2016-05-13 (×5): 3.125 mg via ORAL
  Filled 2016-05-11 (×6): qty 1

## 2016-05-11 MED ORDER — SODIUM CHLORIDE 0.9 % IV SOLN: INTRAVENOUS | Status: DC

## 2016-05-11 MED ORDER — ENOXAPARIN SODIUM 30 MG/0.3ML ~~LOC~~ SOLN
30.0000 mg | SUBCUTANEOUS | Status: DC
  Administered 2016-05-11 – 2016-05-13 (×3): 30 mg via SUBCUTANEOUS
  Filled 2016-05-11 (×4): qty 0.3

## 2016-05-11 MED ORDER — CEFTRIAXONE SODIUM 1 G IJ SOLR
1.0000 g | INTRAMUSCULAR | Status: DC
  Administered 2016-05-11 – 2016-05-13 (×3): 1 g via INTRAVENOUS
  Filled 2016-05-11 (×4): qty 10

## 2016-05-11 MED ORDER — INSULIN ASPART 100 UNIT/ML ~~LOC~~ SOLN
0.0000 [IU] | Freq: Three times a day (TID) | SUBCUTANEOUS | Status: DC
  Administered 2016-05-11 – 2016-05-13 (×6): 1 [IU] via SUBCUTANEOUS

## 2016-05-11 MED ORDER — DONEPEZIL HCL 10 MG PO TABS
10.0000 mg | ORAL_TABLET | Freq: Every day | ORAL | Status: DC
  Administered 2016-05-11 – 2016-05-13 (×3): 10 mg via ORAL
  Filled 2016-05-11 (×3): qty 1

## 2016-05-11 MED ORDER — HYDRALAZINE HCL 20 MG/ML IJ SOLN
10.0000 mg | Freq: Four times a day (QID) | INTRAMUSCULAR | Status: DC | PRN
  Administered 2016-05-13: 10 mg via INTRAVENOUS
  Filled 2016-05-11: qty 1

## 2016-05-11 MED ORDER — RISPERIDONE 0.25 MG PO TABS
0.2500 mg | ORAL_TABLET | Freq: Every day | ORAL | Status: DC
  Filled 2016-05-11 (×2): qty 1

## 2016-05-11 NOTE — Progress Notes (Addendum)
BLADDER SCAN   AMOUNT: 94ml  DUE TO: Urinary Retention

## 2016-05-11 NOTE — Progress Notes (Signed)
Pharmacy Antibiotic Note  Nigel MormonHelen Pratt is a 82 y.o. female admitted on 05/10/2016 with UTI.  Pharmacy has been consulted for Rocephin dosing. Rocephin 1gm IV given in ED ~2330.  Plan: Rocephin 1gm IV q24h Pharmacy will sign off please reconsult if needed  Height: 5' (152.4 cm) Weight: 118 lb 6.2 oz (53.7 kg) IBW/kg (Calculated) : 45.5  Temp (24hrs), Avg:98.5 F (36.9 C), Min:98.5 F (36.9 C), Max:98.5 F (36.9 C)   Recent Labs Lab 05/10/16 2129 05/10/16 2143  WBC 11.5*  --   CREATININE 2.71*  --   LATICACIDVEN  --  0.73    Estimated Creatinine Clearance: 10.3 mL/min (by C-G formula based on Cr of 2.71).    Allergies  Allergen Reactions  . Codeine Other (See Comments)    Reaction: unknown    Thank you for allowing pharmacy to be a part of this patient's care.  Christoper Fabianaron Lynlee Stratton, PharmD, BCPS Clinical pharmacist, pager [phone] 05/11/2016 1:11 AM

## 2016-05-11 NOTE — Consult Note (Signed)
Consultation Note Date: 05/11/2016   Patient Name: Kelly Pratt  DOB: 1925/02/08  MRN: [id number]  Age / Sex: 82 y.o., female  PCP: Sigmund Hazel, MD Referring Physician: Leroy Sea, MD  Reason for Consultation: Establishing goals of care and Psychosocial/spiritual support  HPI/Patient Profile: 82 y.o. female  with past medical history of advanced Dementia, hypertension, diabetes, chronic anemia, chronic kidney disease stage IV admitted on 05/10/2016 with anemia and UTI. Patient's hemoglobin on admission was 6.5. After transfusion patient's hemoglobin is now 10. Her creatinine is 2.71, albumin 2.2. She has had frequent emergency room visits with subsequent discharge back to a skilled nursing facility.. 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  Clinical Assessment and Goals of Care: Patient will attempt to respond when spoken to. She keeps her eyes closed. Her speech is very difficult to understand and consists of 1-3 words. She is not conversational, nor can she follow commands. She has contractures of her lower extremities and cries out with any touch or movement of her legs. Per family patient was ambulatory with a walker up until 3 weeks ago. Additionally they have been seen frequent falls. At this point they made the decision to move her to an assisted living facility. Prior to this she had been living with her daughters on a rotating basis. She has been having a sundowning pattern both in the facility as well as in their homes. She also has trouble sleeping at night and sounds as though she has her days and nights confused.  HCPOA daughter Ailine Hefferan; patient has 2 other daughters, Celestia Khat    SUMMARY OF RECOMMENDATIONS   DO NOT RESUSCITATE DO NOT INTUBATE per conversation by Dr. Thedore Mins with patient's daughter Eunice Blase. Other daughters are in agreement with this  plan Bulk of this assessment was education and differentiation between hospice and palliative care as well as extensive education on disease processes related to dementia specifically functional decline and how this leads to recurrent urinary tract infections as well as aspiration pneumonia Family reports not being aware of disease progression related to dementia Introduce concepts that family and patient can face in the very near future such as aspiration pneumonia, dysphagia. Also introduced the topic of feeding tubes, and how they are contraindicated in dementia. Specifically how they do not alter the disease course. As she was ambulatory until approximately 3 weeks ago and per her previous med list which included Vicodin and daughter Eunice Blase reports oxycodone in the home setting, I wonder if unmanaged pain is an aspect of her inability or, unwillingness to straighten her legs, attempt to ambulate I question whether patient at this point can return to baseline and resume living at assisted living level of care   This assessment Code Status/Advance Care Planning:  DNR    Symptom Management:   Pain: Scheduled Tylenol 500 mg twice daily and when necessary for mild to moderate pain. This may improve patient's functional status in terms of movement. Concern for this patient would be sedating medications  that could lead to a fall, however Ultram 50 mg every 8 when necessary may be an option going forward  Agitation/Sundowning: Patient is currently on low doses of to second-generation antipsychotic medications; recommend discontinuation of Risperdal, up titration of Zyprexa for further effect. Zyprexa may have a beneficial effect on her appetite with an improved side effect profile. We'll also change time to administer 8 PM.  Palliative Prophylaxis:   Aspiration, Bowel Regimen, Delirium Protocol, Eye Care, Frequent Pain Assessment and Turn Reposition  Additional Recommendations (Limitations,  Scope, Preferences):  Full course of treatment at this point except for DO NOT RESUSCITATE DO NOT INTUBATE  Psycho-social/Spiritual:   Desire for further Chaplaincy support:no  Additional Recommendations: Grief/Bereavement Support  Prognosis:   Unable to determine. Difficult to determine her prognosis, she is at high risk for sepsis secondary to her debility; aspiration pneumonia recurrent UTI. Given the severity of her dementia do feel the patient would meet \ Medicare guidelines for admission to in-home/facility hospice  Discharge Planning: To Be Determined      Primary Diagnoses: Present on Admission:  . Acute encephalopathy . Renal failure (ARF), acute on chronic (HCC) . Dementia . Essential hypertension . ARF (acute renal failure) (HCC)  I have reviewed the medical record, interviewed the patient and family, and examined the patient. The following aspects are pertinent.  Past Medical History  Diagnosis Date  . Diabetes mellitus without complication (HCC)   . Hypertension    Social History   Social History  . Marital Status: Single    Spouse Name: N/A  . Number of Children: N/A  . Years of Education: N/A   Social History Main Topics  . Smoking status: Former Games developer  . Smokeless tobacco: None  . Alcohol Use: No  . Drug Use: No  . Sexual Activity: Not Asked   Other Topics Concern  . None   Social History Narrative   Family History  Problem Relation Age of Onset  . Hypertension Other    Scheduled Meds: . amLODipine  5 mg Oral Daily  . atorvastatin  20 mg Oral Daily  . carvedilol  3.125 mg Oral BID WC  . cefTRIAXone (ROCEPHIN)  IV  1 g Intravenous Q24H  . donepezil  10 mg Oral QHS  . enoxaparin (LOVENOX) injection  30 mg Subcutaneous Q24H  . insulin aspart  0-9 Units Subcutaneous TID WC  . OLANZapine  2.5 mg Oral QHS  . omega-3 acid ethyl esters  1 g Oral Daily  . risperiDONE  0.25 mg Oral QHS   Continuous Infusions: . dextrose     PRN  Meds:.hydrALAZINE Medications Prior to Admission:  Prior to Admission medications   Medication Sig Start Date End Date Taking? Authorizing Provider  acetaminophen (TYLENOL) 325 MG tablet Take 650 mg by mouth 2 (two) times daily.   Yes Historical Provider, MD  amLODipine (NORVASC) 5 MG tablet Take 5 mg by mouth daily. 05/01/15  Yes Historical Provider, MD  atorvastatin (LIPITOR) 20 MG tablet Take 20 mg by mouth daily.   Yes Historical Provider, MD  donepezil (ARICEPT) 5 MG tablet Take 10 mg by mouth at bedtime.  04/21/15  Yes Historical Provider, MD  hydrochlorothiazide (MICROZIDE) 12.5 MG capsule Take 12.5 mg by mouth daily.   Yes Historical Provider, MD  metFORMIN (GLUCOPHAGE) 500 MG tablet Take 500 mg by mouth daily with breakfast.  05/30/15  Yes Historical Provider, MD  Multiple Vitamin (MULTIVITAMIN WITH MINERALS) TABS tablet Take 1 tablet by mouth daily.   Yes  Historical Provider, MD  OLANZapine (ZYPREXA) 2.5 MG tablet Take 2.5 mg by mouth at bedtime.   Yes Historical Provider, MD  omega-3 acid ethyl esters (LOVAZA) 1 g capsule Take 1 g by mouth daily.   Yes Historical Provider, MD  risperiDONE (RISPERDAL) 0.25 MG tablet Take 0.25 mg by mouth at bedtime.   Yes Historical Provider, MD  HYDROcodone-acetaminophen (NORCO/VICODIN) 5-325 MG tablet Take 1 tablet by mouth every 6 (six) hours as needed for moderate pain. Patient not taking: Reported on 05/10/2016 12/18/15   Bethann Berkshire, MD  HYDROcodone-acetaminophen (NORCO/VICODIN) 5-325 MG tablet Take 1 tablet by mouth every 6 (six) hours as needed for moderate pain. Patient not taking: Reported on 04/01/2016 12/18/15   Bethann Berkshire, MD   Allergies  Allergen Reactions  . Codeine Other (See Comments)    Reaction: unknown   Review of Systems  Unable to perform ROS: Dementia    Physical Exam  Constitutional:  Frail elderly female; LE contractures  HENT:  Head: Normocephalic and atraumatic.  Cardiovascular:  irrg  Pulmonary/Chest: Effort  normal.  Musculoskeletal:  Does not move LE  Neurological:  Lethargic; speech difficult to understand  Skin: Skin is warm and dry.  Psychiatric:  Agitated with touch  Nursing note and vitals reviewed.   Vital Signs: BP 157/126 mmHg  Pulse 89  Temp(Src) 98.1 F (36.7 C) (Axillary)  Resp 18  Ht 5' (1.524 m)  Wt 53.7 kg (118 lb 6.2 oz)  BMI 23.12 kg/m2  SpO2 100% Pain Assessment: 0-10   Pain Score: 0-No pain   SpO2: SpO2: 100 % O2 Device:SpO2: 100 % O2 Flow Rate: .   IO: Intake/output summary:  Intake/Output Summary (Last 24 hours) at 05/11/16 1432 Last data filed at 05/11/16 1610  Gross per 24 hour  Intake    350 ml  Output      0 ml  Net    350 ml    LBM: Last BM Date: 05/10/16 Baseline Weight: Weight: 54.432 kg (120 lb) Most recent weight: Weight: 53.7 kg (118 lb 6.2 oz)     Palliative Assessment/Data:   Flowsheet Rows        Most Recent Value   Intake Tab    Referral Department  Hospitalist   Unit at Time of Referral  Med/Surg Unit   Palliative Care Primary Diagnosis  Sepsis/Infectious Disease   Date Notified  05/10/16   Palliative Care Type  New Palliative care   Reason for referral  Clarify Goals of Care   Date of Admission  05/10/16   Date first seen by Palliative Care  05/11/16   # of days Palliative referral response time  1 Day(s)   # of days IP prior to Palliative referral  0   Clinical Assessment    Palliative Performance Scale Score  30%   Pain Max last 24 hours  Not able to report   Pain Min Last 24 hours  Not able to report   Dyspnea Max Last 24 Hours  Not able to report   Dyspnea Min Last 24 hours  Not able to report   Nausea Max Last 24 Hours  Not able to report   Nausea Min Last 24 Hours  Not able to report   Anxiety Max Last 24 Hours  Not able to report   Anxiety Min Last 24 Hours  Not able to report   Other Max Last 24 Hours  Not able to report   Psychosocial & Spiritual Assessment    Palliative  Care Outcomes    Patient/Family  meeting held?  Yes   Who was at the meeting?  3 daughters      Time In: 1600 Time Out: 1715 Time Total: 75 min Greater than 50%  of this time was spent counseling and coordinating care related to the above assessment and plan.  Signed by: Irean HongSarah Grace Ahuva Poynor, NP   Please contact Palliative Medicine Team phone at [phone] for questions and concerns.  For individual provider: See Loretha StaplerAmion

## 2016-05-11 NOTE — Progress Notes (Signed)
PROGRESS NOTE                                                                                                                                                                                                             Patient Demographics:    Kelly Pratt, is a 82 y.o. female, DOB - 11/29/1925, GNF:[id number]  Admit date - 05/10/2016   Admitting Physician Eduard ClosArshad N Kakrakandy, MD  Outpatient Primary MD for the patient is Neldon LabellaMILLER,LISA LYNN, MD  LOS -   Chief Complaint  Patient presents with  . Altered Mental Status       Brief Narrative   82 y.o. female with medical history significant of advanced dementia, hypertension, diabetes mellitus, chronic anemia, chronic kidney disease stage IV was brought to the ER from an assisted living facility after patient was found to be increasingly lethargic, in the ER found to have UTI, dehydration, acute renal failure along with encephalopathy and we were requested to admit the patient.   Subjective:    Kelly MormonHelen Pratt today In bed, somnolent, unable to answer questions.   Assessment  & Plan :     1.Toxic encephalopathy on top of advanced dementia. Due to UTI, agree with empiric IV Rocephin, bladder scan shows less than 100 mL, continue gentle hydration, follow cultures. For now nothing by mouth except medications, speech therapy to evaluate.  2. Advanced dementia. Discussed with daughter, at baseline poor functional status and mentation, supportive care, made DO NOT RESUSCITATE, for now continued donepezil along with risperidone.  3. Dyslipidemia on statin.  4. Hypertension. Currently on Norvasc, will place on low-dose beta blocker as blood pressure high as needed hydralazine.  5. Chronic anemia. Required 2 units of packed RBC transfusion, anemia panel pending, Hemoccult negative.  6. ARF on CK  D stage IV. Baseline creatinine is 1.6, this is prerenal due to anemia, transfused, gentle hydration and monitor.  7. Dehydration with hyponatremia. Hydrate with D5W gently and monitor.  8. DM type II. ISS.  CBG (last 3)   Recent Labs  05/11/16 0737 05/11/16 0741  GLUCAP 127* 139*      Code Status :  DO NOT RESUSCITATE  Family Communication  :  Discussed with daughter Eunice BlaseDebbie  Disposition Plan  :  Likely SNF  Consults  : Palliative care  Procedures  :    Head  CT nonacute  DVT Prophylaxis  :  Lovenox   Lab Results  Component Value Date   PLT 216 05/10/2016    Inpatient Medications  Scheduled Meds: . amLODipine  5 mg Oral Daily  . atorvastatin  20 mg Oral Daily  . cefTRIAXone (ROCEPHIN)  IV  1 g Intravenous Q24H  . donepezil  10 mg Oral QHS  . enoxaparin (LOVENOX) injection  30 mg Subcutaneous Q24H  . insulin aspart  0-9 Units Subcutaneous TID WC  . OLANZapine  2.5 mg Oral QHS  . omega-3 acid ethyl esters  1 g Oral Daily  . risperiDONE  0.25 mg Oral QHS   Continuous Infusions:  PRN Meds:.  Antibiotics  :    Anti-infectives    Start     Dose/Rate Route Frequency Ordered Stop   05/11/16 2200  cefTRIAXone (ROCEPHIN) 1 g in dextrose 5 % 50 mL IVPB     1 g 100 mL/hr over 30 Minutes Intravenous Every 24 hours 05/11/16 0113     05/10/16 2200  cefTRIAXone (ROCEPHIN) 1 g in dextrose 5 % 50 mL IVPB     1 g 100 mL/hr over 30 Minutes Intravenous  Once 05/10/16 2156 05/11/16 0002         Objective:   Filed Vitals:   05/11/16 0501 05/11/16 0558 05/11/16 0625 05/11/16 0853  BP: 118/85 156/91 145/85 157/126  Pulse: 91 87 88 89  Temp: 98.4 F (36.9 C) 98.4 F (36.9 C) 98.1 F (36.7 C)   TempSrc: Axillary  Axillary   Resp: 17 17 18    Height:      Weight:      SpO2: 100% 100% 100%     Wt Readings from Last 3 Encounters:  05/11/16 53.7 kg (118 lb 6.2 oz)  04/30/16 68.04 kg (150 lb)     Intake/Output Summary (Last 24 hours) at 05/11/16 1058 Last data  filed at 05/11/16 0610  Gross per 24 hour  Intake    350 ml  Output      0 ml  Net    350 ml     Physical Exam  Somnolent and unable to cooperate in exam runs a questions, moves all 4 extremities [place].AT,PERRAL Supple Neck,No JVD, No cervical lymphadenopathy appriciated.  Symmetrical Chest wall movement, Good air movement bilaterally, CTAB RRR,No Gallops,Rubs or new Murmurs, No Parasternal Heave +ve B.Sounds, Abd Soft, No tenderness, No organomegaly appriciated, No rebound - guarding or rigidity. No Cyanosis, Clubbing or edema, No new Rash or bruise      Data Review:    CBC  Recent Labs Lab 05/10/16 2129  WBC 11.5*  HGB 6.5*  HCT 21.1*  PLT 216  MCV 81.8  MCH 25.2*  MCHC 30.8  RDW 16.2*  LYMPHSABS 1.8  MONOABS 0.7  EOSABS 0.1  BASOSABS 0.0    Chemistries   Recent Labs Lab 05/10/16 2129  NA 146*  K 4.5  CL 116*  CO2 19*  GLUCOSE 149*  BUN 103*  CREATININE 2.71*  CALCIUM 8.8*  AST 38  ALT 27  ALKPHOS 70  BILITOT 0.5   ------------------------------------------------------------------------------------------------------------------ No results for input(s): CHOL, HDL, LDLCALC, TRIG, CHOLHDL, LDLDIRECT in the last 72 hours.  No results found for: HGBA1C ------------------------------------------------------------------------------------------------------------------ No results for input(s): TSH, T4TOTAL, T3FREE, THYROIDAB in the last 72 hours.  Invalid input(s): FREET3 ------------------------------------------------------------------------------------------------------------------ No results for input(s): VITAMINB12, FOLATE, FERRITIN, TIBC, IRON, RETICCTPCT in the last 72 hours.  Coagulation profile No results for input(s): INR, PROTIME in the last  168 hours.  No results for input(s): DDIMER in the last 72 hours.  Cardiac Enzymes No results for input(s): CKMB, TROPONINI, MYOGLOBIN in the last 168 hours.  Invalid input(s):  CK ------------------------------------------------------------------------------------------------------------------    Component Value Date/Time   BNP 262.5* 04/01/2016 1442    Micro Results Recent Results (from the past 240 hour(s))  MRSA PCR Screening     Status: None   Collection Time: 05/11/16 12:52 AM  Result Value Ref Range Status   MRSA by PCR NEGATIVE NEGATIVE Final    Comment:        The GeneXpert MRSA Assay (FDA approved for NASAL specimens only), is one component of a comprehensive MRSA colonization surveillance program. It is not intended to diagnose MRSA infection nor to guide or monitor treatment for MRSA infections.     Radiology Reports Dg Chest 2 View  04/30/2016  CLINICAL DATA:  Altered mental status.  History of dementia. EXAM: CHEST  2 VIEW COMPARISON:  04/01/2016. FINDINGS: The cardiac silhouette remains mildly enlarged. Normal vascularity. Clear lungs with minimal diffuse peribronchial thickening. Diffuse osteopenia. Bilateral shoulder degenerative changes and superior migration of the humeral heads, compatible chronic rotator cuff tears. Thoracic spine degenerative changes. IMPRESSION: 1. No acute abnormality. 2. Stable mild cardiomegaly. 3. Minimal chronic bronchitic changes. Electronically Signed   By: Beckie Salts M.D.   On: 04/30/2016 14:37   Ct Head Wo Contrast  05/11/2016  CLINICAL DATA:  Acute encephalopathy. History of hypertension and diabetes. EXAM: CT HEAD WITHOUT CONTRAST TECHNIQUE: Contiguous axial images were obtained from the base of the skull through the vertex without intravenous contrast. COMPARISON:  04/30/2016; 07/04/2015 FINDINGS: Brain: Re- demonstrated sequela of prior left MCA distribution infarct (image 18, series 2). Similar findings of atrophy with diffuse sulcal prominence. Extensive periventricular hypodensities compatible microvascular ischemic disease. Old lacunar infarcts within the left insular cortex (image 14, series 2), right  basal ganglia (image 15) and right cerebellar hemisphere (image 9). Given extensive background parenchymal abnormalities, there is no CT evidence of superimposed acute large territory infarct. No intraparenchymal or extra-axial mass or hemorrhage. Unchanged size and configuration of the ventricles and basilar cisterns. No midline shift. Vascular: Intracranial atherosclerosis Skull: Negative for fracture or focal lesion. Sinuses/Orbits: Post bilateral cataract surgery. There is underpneumatization the bilateral frontal sinuses. The remaining paranasal sinuses and mastoid air cells are normally aerated. Other: Regional soft tissues appear normal. IMPRESSION: 1. No acute intracranial process. 2. Similar findings of atrophy, prior left MCA distribution infarct and microvascular ischemic disease. Electronically Signed   By: Simonne Come M.D.   On: 05/11/2016 08:17   Ct Head Wo Contrast  04/30/2016  CLINICAL DATA:  Altered mental status and history of dementia. EXAM: CT HEAD WITHOUT CONTRAST TECHNIQUE: Contiguous axial images were obtained from the base of the skull through the vertex without intravenous contrast. COMPARISON:  07/04/2015. FINDINGS: Stable advanced small vessel ischemic changes in the periventricular white matter. The brain demonstrates no evidence of hemorrhage, infarction, edema, mass effect, extra-axial fluid collection, hydrocephalus or mass lesion. The skull is unremarkable and shows no evidence of fracture. IMPRESSION: Stable chronic advanced small vessel ischemic disease. No acute findings. Electronically Signed   By: Irish Lack M.D.   On: 04/30/2016 15:00    Time Spent in minutes  30   Shavonne Ambroise K M.D on 05/11/2016 at 10:58 AM  Between 7am to 7pm - Pager - [phone]  After 7pm go to [email or web address] - password [facility]  [facility] -  Office  [phone]

## 2016-05-11 NOTE — Progress Notes (Signed)
Family meeting set for 4pm 5/20 with 3 daughters. Eduard RouxSarah Sabree Nuon, ANP

## 2016-05-11 NOTE — H&P (Signed)
History and Physical    Kelly MormonHelen Pratt [id number] DOB: 02/03/1925 DOA: 05/10/2016  PCP: Neldon LabellaMILLER,LISA LYNN, MD  Patient coming from: Nursing home.  Chief Complaint: Increasing confusion and lethargy.  History obtained from ER physician as patient has dementia and confusion. No family available at this time.  HPI: Kelly MormonHelen Pratt is a 82 y.o. female with medical history significant of dementia, hypertension, diabetes mellitus, chronic anemia, chronic kidney disease stage IV was brought to the ER from nursing home after patient was found to be increasingly lethargic. Patient also was confused from baseline. In the ER urine study shows features consistent with UTI. Patient's chronic anemia has further worsened with hemoglobin around 6 and usually patient's hemoglobin is around 9. Stool for occult blood was negative. On exam patient is responding to her name and moves her upper extremities. Patient had come with similar complaints last week and at that time CT head was negative. Patient was started on empiric antibiotics and PRBC transfusion has been ordered and will be admitted for further management.   ED Course: As per the history of present illness.  Review of Systems: As per HPI otherwise 10 point review of systems negative.    Past Medical History  Diagnosis Date  . Diabetes mellitus without complication (HCC)   . Hypertension     Past Surgical History  Procedure Laterality Date  . Abdominal hysterectomy       reports that she has quit smoking. She does not have any smokeless tobacco history on file. She reports that she does not drink alcohol or use illicit drugs.  Allergies  Allergen Reactions  . Codeine Other (See Comments)    Reaction: unknown    Family History  Problem Relation Age of Onset  . Hypertension Other     Prior to Admission medications   Medication Sig Start Date End Date Taking? Authorizing Provider  acetaminophen (TYLENOL) 325 MG tablet Take 650 mg by mouth  2 (two) times daily.   Yes Historical Provider, MD  amLODipine (NORVASC) 5 MG tablet Take 5 mg by mouth daily. 05/01/15  Yes Historical Provider, MD  atorvastatin (LIPITOR) 20 MG tablet Take 20 mg by mouth daily.   Yes Historical Provider, MD  donepezil (ARICEPT) 5 MG tablet Take 10 mg by mouth at bedtime.  04/21/15  Yes Historical Provider, MD  hydrochlorothiazide (MICROZIDE) 12.5 MG capsule Take 12.5 mg by mouth daily.   Yes Historical Provider, MD  metFORMIN (GLUCOPHAGE) 500 MG tablet Take 500 mg by mouth daily with breakfast.  05/30/15  Yes Historical Provider, MD  Multiple Vitamin (MULTIVITAMIN WITH MINERALS) TABS tablet Take 1 tablet by mouth daily.   Yes Historical Provider, MD  OLANZapine (ZYPREXA) 2.5 MG tablet Take 2.5 mg by mouth at bedtime.   Yes Historical Provider, MD  omega-3 acid ethyl esters (LOVAZA) 1 g capsule Take 1 g by mouth daily.   Yes Historical Provider, MD  risperiDONE (RISPERDAL) 0.25 MG tablet Take 0.25 mg by mouth at bedtime.   Yes Historical Provider, MD  HYDROcodone-acetaminophen (NORCO/VICODIN) 5-325 MG tablet Take 1 tablet by mouth every 6 (six) hours as needed for moderate pain. Patient not taking: Reported on 05/10/2016 12/18/15   Bethann BerkshireJoseph Zammit, MD  HYDROcodone-acetaminophen (NORCO/VICODIN) 5-325 MG tablet Take 1 tablet by mouth every 6 (six) hours as needed for moderate pain. Patient not taking: Reported on 04/01/2016 12/18/15   Bethann BerkshireJoseph Zammit, MD    Physical Exam: Filed Vitals:   05/10/16 2300 05/10/16 2315 05/10/16 2330 05/11/16 0037  BP: 120/61 139/48 141/98 164/49  Pulse: 87 88 77 86  Temp:    98.5 F (36.9 C)  TempSrc:      Resp: 18 17 23 20   Height:    5' (1.524 m)  Weight:    118 lb 6.2 oz (53.7 kg)  SpO2: 100% 100% 100% 100%      Constitutional: Not in distress. Filed Vitals:   05/10/16 2300 05/10/16 2315 05/10/16 2330 05/11/16 0037  BP: 120/61 139/48 141/98 164/49  Pulse: 87 88 77 86  Temp:    98.5 F (36.9 C)  TempSrc:      Resp: 18 17 23  20   Height:    5' (1.524 m)  Weight:    118 lb 6.2 oz (53.7 kg)  SpO2: 100% 100% 100% 100%   Eyes: Anicteric mild pallor. ENMT: No discharge from the ears eyes nose or mouth. Neck: No neck rigidity. No mass felt. Respiratory: No rhonchi or crepitations.  Cardiovascular: S1-S2 heard. Abdomen: Soft nontender bowel sounds present. Musculoskeletal: No edema. Skin: No rash. Neurologic: Appears drowsy but follows commands moves upper extremities responds to her name oriented to name only. Perla positive. Psychiatric: Patient has dementia.   Labs on Admission: I have personally reviewed following labs and imaging studies  CBC:  Recent Labs Lab 05/10/16 2129  WBC 11.5*  NEUTROABS 8.9*  HGB 6.5*  HCT 21.1*  MCV 81.8  PLT 216   Basic Metabolic Panel:  Recent Labs Lab 05/10/16 2129  NA 146*  K 4.5  CL 116*  CO2 19*  GLUCOSE 149*  BUN 103*  CREATININE 2.71*  CALCIUM 8.8*   GFR: Estimated Creatinine Clearance: 10.3 mL/min (by C-G formula based on Cr of 2.71). Liver Function Tests:  Recent Labs Lab 05/10/16 2129  AST 38  ALT 27  ALKPHOS 70  BILITOT 0.5  PROT 5.8*  ALBUMIN 2.2*   No results for input(s): LIPASE, AMYLASE in the last 168 hours. No results for input(s): AMMONIA in the last 168 hours. Coagulation Profile: No results for input(s): INR, PROTIME in the last 168 hours. Cardiac Enzymes: No results for input(s): CKTOTAL, CKMB, CKMBINDEX, TROPONINI in the last 168 hours. BNP (last 3 results) No results for input(s): PROBNP in the last 8760 hours. HbA1C: No results for input(s): HGBA1C in the last 72 hours. CBG: No results for input(s): GLUCAP in the last 168 hours. Lipid Profile: No results for input(s): CHOL, HDL, LDLCALC, TRIG, CHOLHDL, LDLDIRECT in the last 72 hours. Thyroid Function Tests: No results for input(s): TSH, T4TOTAL, FREET4, T3FREE, THYROIDAB in the last 72 hours. Anemia Panel: No results for input(s): VITAMINB12, FOLATE, FERRITIN,  TIBC, IRON, RETICCTPCT in the last 72 hours. Urine analysis:    Component Value Date/Time   COLORURINE YELLOW 05/10/2016 1933   APPEARANCEUR HAZY* 05/10/2016 1933   LABSPEC 1.019 05/10/2016 1933   PHURINE 5.0 05/10/2016 1933   GLUCOSEU NEGATIVE 05/10/2016 1933   HGBUR NEGATIVE 05/10/2016 1933   BILIRUBINUR NEGATIVE 05/10/2016 1933   KETONESUR NEGATIVE 05/10/2016 1933   PROTEINUR NEGATIVE 05/10/2016 1933   UROBILINOGEN 0.2 07/04/2015 1241   NITRITE NEGATIVE 05/10/2016 1933   LEUKOCYTESUR LARGE* 05/10/2016 1933   Sepsis Labs: @LABRCNTIP (procalcitonin:4,lacticidven:4) )No results found for this or any previous visit (from the past 240 hour(s)).   Radiological Exams on Admission: No results found.  EKG: Independently reviewed. Normal sinus rhythm with LVH.  Assessment/Plan Principal Problem:   Acute encephalopathy Active Problems:   Renal failure (ARF), acute on chronic (HCC)   Absolute  anemia   UTI (lower urinary tract infection)   Dementia   Essential hypertension    #1. Acute encephalopathy most likely from UTI and worsening renal function and anemia - patient has been placed on ceftriaxone for UTI and for her worsening renal function patient will be receiving blood transfusion and repeat metabolic panel and CBC after transfusion. Check ammonia levels. If patient's mental status does not improve then may need MRI brain. #2. Acute on chronic kidney disease stage IV with metabolic acidosis - I think patient's creatinine will improve with PRBC transfusion. Closely follow metabolic panel. #3. Chronic anemia with worsening - check anemia panel. Patient is receiving packed red blood cell transfusion. Stool for occult blood has been negative. #4. Dementia with behavioral changes - on Aricept and Zyprexa and Risperdal. #5. Hypertension - on amlodipine. #6. Diabetes mellitus type 2 - will hold metformin secondary to renal failure. Patient is placed on sliding scale coverage. #7.  Hyperlipidemia on statins.  Since patient has doubtful swallowing Ability I have requested speech therapist consult. Palliative care consult for goals of care. Will need to discuss with family in a.m. once available with regarding to goals.   DVT prophylaxis: Heparin. Code Status: Full code.  Family Communication: No family at the bedside.  Disposition Plan: Nursing home.  Consults called: None.  Admission status: Inpatient. Telemetry.    Eduard Clos MD [facility] Pager [phone].  If 7PM-7AM, please contact night-coverage [email or web address] Password [email or web address]  05/11/2016, 12:51 AM

## 2016-05-12 ENCOUNTER — Inpatient Hospital Stay (HOSPITAL_COMMUNITY): Payer: Medicare Other

## 2016-05-12 DIAGNOSIS — F0390 Unspecified dementia without behavioral disturbance: Secondary | ICD-10-CM

## 2016-05-12 LAB — MAGNESIUM: MAGNESIUM: 2.1 mg/dL (ref 1.7–2.4)

## 2016-05-12 LAB — COMPREHENSIVE METABOLIC PANEL
ALT: 23 U/L (ref 14–54)
ANION GAP: 8 (ref 5–15)
AST: 40 U/L (ref 15–41)
Albumin: 1.9 g/dL — ABNORMAL LOW (ref 3.5–5.0)
Alkaline Phosphatase: 58 U/L (ref 38–126)
BILIRUBIN TOTAL: 0.7 mg/dL (ref 0.3–1.2)
BUN: 67 mg/dL — AB (ref 6–20)
CHLORIDE: 114 mmol/L — AB (ref 101–111)
CO2: 17 mmol/L — ABNORMAL LOW (ref 22–32)
Calcium: 8.6 mg/dL — ABNORMAL LOW (ref 8.9–10.3)
Creatinine, Ser: 1.7 mg/dL — ABNORMAL HIGH (ref 0.44–1.00)
GFR calc Af Amer: 30 mL/min — ABNORMAL LOW (ref >60)
GFR, EST NON AFRICAN AMERICAN: 26 mL/min — AB (ref >60)
Glucose, Bld: 182 mg/dL — ABNORMAL HIGH (ref 65–99)
POTASSIUM: 4.6 mmol/L (ref 3.5–5.1)
Sodium: 139 mmol/L (ref 135–145)
TOTAL PROTEIN: 5 g/dL — AB (ref 6.5–8.1)

## 2016-05-12 LAB — CBC
HCT: 30.4 % — ABNORMAL LOW (ref 36.0–46.0)
HEMATOCRIT: 32 % — AB (ref 36.0–46.0)
HEMOGLOBIN: 10 g/dL — AB (ref 12.0–15.0)
Hemoglobin: 9.7 g/dL — ABNORMAL LOW (ref 12.0–15.0)
MCH: 26.4 pg (ref 26.0–34.0)
MCH: 26.5 pg (ref 26.0–34.0)
MCHC: 31.9 g/dL (ref 30.0–36.0)
MCHC: 31.3 g/dL (ref 30.0–36.0)
MCV: 82.6 fL (ref 78.0–100.0)
MCV: 84.9 fL (ref 78.0–100.0)
PLATELETS: 253 10*3/uL (ref 150–400)
Platelets: 240 10*3/uL (ref 150–400)
RBC: 3.68 MIL/uL — ABNORMAL LOW (ref 3.87–5.11)
RBC: 3.77 MIL/uL — AB (ref 3.87–5.11)
RDW: 15.9 % — AB (ref 11.5–15.5)
RDW: 15.9 % — AB (ref 11.5–15.5)
WBC: 9.9 10*3/uL (ref 4.0–10.5)
WBC: 10.3 10*3/uL (ref 4.0–10.5)

## 2016-05-12 LAB — TYPE AND SCREEN
ABO/RH(D): O POS
Antibody Screen: NEGATIVE
Unit division: 0
Unit division: 0

## 2016-05-12 LAB — BASIC METABOLIC PANEL
Anion gap: 13 (ref 5–15)
BUN: 71 mg/dL — AB (ref 6–20)
CALCIUM: 8.9 mg/dL (ref 8.9–10.3)
CO2: 18 mmol/L — ABNORMAL LOW (ref 22–32)
Chloride: 111 mmol/L (ref 101–111)
Creatinine, Ser: 1.79 mg/dL — ABNORMAL HIGH (ref 0.44–1.00)
GFR calc Af Amer: 28 mL/min — ABNORMAL LOW (ref >60)
GFR, EST NON AFRICAN AMERICAN: 24 mL/min — AB (ref >60)
GLUCOSE: 131 mg/dL — AB (ref 65–99)
POTASSIUM: 4.1 mmol/L (ref 3.5–5.1)
SODIUM: 142 mmol/L (ref 135–145)

## 2016-05-12 LAB — GLUCOSE, CAPILLARY
GLUCOSE-CAPILLARY: 127 mg/dL — AB (ref 65–99)
GLUCOSE-CAPILLARY: 162 mg/dL — AB (ref 65–99)
GLUCOSE-CAPILLARY: 123 mg/dL — AB (ref 65–99)
GLUCOSE-CAPILLARY: 158 mg/dL — AB (ref 65–99)
Glucose-Capillary: 117 mg/dL — ABNORMAL HIGH (ref 65–99)

## 2016-05-12 LAB — URINE CULTURE: Culture: >=100000 — AB

## 2016-05-12 LAB — LACTIC ACID, PLASMA
LACTIC ACID, VENOUS: 1.1 mmol/L (ref 0.5–2.0)
LACTIC ACID, VENOUS: 1.1 mmol/L (ref 0.5–2.0)

## 2016-05-12 LAB — PROTIME-INR
INR: 1.35 (ref 0.00–1.49)
Prothrombin Time: 16.8 seconds — ABNORMAL HIGH (ref 11.6–15.2)

## 2016-05-12 MED ORDER — POLYETHYLENE GLYCOL 3350 17 G PO PACK
17.0000 g | PACK | Freq: Two times a day (BID) | ORAL | Status: DC
Start: 2016-05-12 — End: 2016-05-14
  Administered 2016-05-12 – 2016-05-13 (×3): 17 g via ORAL
  Filled 2016-05-12 (×3): qty 1

## 2016-05-12 MED ORDER — MORPHINE SULFATE (PF) 2 MG/ML IV SOLN
2.0000 mg | INTRAVENOUS | Status: DC | PRN
  Administered 2016-05-12: 2 mg via INTRAVENOUS
  Filled 2016-05-12: qty 1

## 2016-05-12 MED ORDER — BISACODYL 5 MG PO TBEC
10.0000 mg | DELAYED_RELEASE_TABLET | Freq: Every day | ORAL | Status: DC
  Administered 2016-05-13: 10 mg via ORAL
  Filled 2016-05-12 (×2): qty 2

## 2016-05-12 MED ORDER — DOCUSATE SODIUM 100 MG PO CAPS
200.0000 mg | ORAL_CAPSULE | Freq: Two times a day (BID) | ORAL | Status: DC
  Administered 2016-05-12 – 2016-05-13 (×2): 200 mg via ORAL
  Filled 2016-05-12 (×2): qty 2

## 2016-05-12 NOTE — Progress Notes (Signed)
PROGRESS NOTE                                                                                                                                                                                                             Patient Demographics:    Kelly Pratt, is a 82 y.o. female, DOB - 09/28/1925, ZOX:[id number]  Admit date - 05/10/2016   Admitting Physician Eduard ClosArshad N Kakrakandy, MD  Outpatient Primary MD for the patient is Neldon LabellaMILLER,LISA LYNN, MD  LOS - 1  Chief Complaint  Patient presents with  . Altered Mental Status       Brief Narrative   82 y.o. female with medical history significant of advanced dementia, hypertension, diabetes mellitus, chronic anemia, chronic kidney disease stage IV was brought to the ER from an assisted living facility after patient was found to be increasingly lethargic, in the ER found to have UTI, dehydration, acute renal failure along with encephalopathy and we were requested to admit the patient.   Subjective:    Kelly Pratt today In bed, somnolent, No headache or chest pain and complains of some abdominal pain which is generalized.   Assessment  & Plan :     1.Toxic encephalopathy on top of advanced dementia. Due to UTI, agree with empiric IV Rocephin, bladder scan shows less than 100 mL, continue gentle hydration, follow cultures. Speech therapy following currently on dysphagia 2 diet.  2. Advanced dementia. Discussed with daughter, at baseline poor functional status and mentation, supportive care, made DO NOT RESUSCITATE, for now continued donepezil along with risperidone.  3. Dyslipidemia on statin.  4. Hypertension. Currently on Norvasc, added beta blocker +  as needed hydralazine.  5. Chronic anemia. Required 2 units of packed RBC transfusion in ER, anemia panel was stable, Hemoccult negative.  Outpatient hematology follow-up. post transfusion H&H stable.  6. ARF on CK D stage IV. Baseline creatinine is 1.6, this is prerenal due to anemia, transfused, gentle hydration and monitor.  7. Dehydration with hyponatremia. Hydrated with D5W gently and monitor.  8. Constipation with stool burden and abdominal pain. Bowel disimpaction, soapsuds enema, bowel regimen.   9. DM type II. ISS.  CBG (last 3)   Recent Labs  05/11/16 2319 05/12/16 0633 05/12/16 0758  GLUCAP 122* 117* 127*      Code Status :  DO NOT RESUSCITATE  Family Communication  :  Discussed with daughter Eunice Blase  Disposition Plan  :  Likely SNF  Consults  : Palliative care  Procedures  :    Head CT nonacute  DVT Prophylaxis  :  Lovenox   Lab Results  Component Value Date   PLT 253 05/12/2016    Inpatient Medications  Scheduled Meds: . acetaminophen  500 mg Oral BID  . amLODipine  5 mg Oral Daily  . atorvastatin  20 mg Oral Daily  . [START ON 05/13/2016] bisacodyl  10 mg Oral Daily  . carvedilol  3.125 mg Oral BID WC  . cefTRIAXone (ROCEPHIN)  IV  1 g Intravenous Q24H  . docusate sodium  200 mg Oral BID  . donepezil  10 mg Oral QHS  . enoxaparin (LOVENOX) injection  30 mg Subcutaneous Q24H  . erythromycin   Both Eyes Q6H  . insulin aspart  0-9 Units Subcutaneous TID WC  . OLANZapine  5 mg Oral QHS  . omega-3 acid ethyl esters  1 g Oral Daily  . polyethylene glycol  17 g Oral BID   Continuous Infusions:  PRN Meds:.  Antibiotics  :    Anti-infectives    Start     Dose/Rate Route Frequency Ordered Stop   05/11/16 2200  cefTRIAXone (ROCEPHIN) 1 g in dextrose 5 % 50 mL IVPB     1 g 100 mL/hr over 30 Minutes Intravenous Every 24 hours 05/11/16 0113     05/10/16 2200  cefTRIAXone (ROCEPHIN) 1 g in dextrose 5 % 50 mL IVPB     1 g 100 mL/hr over 30 Minutes Intravenous  Once 05/10/16 2156 05/11/16 0002         Objective:   Filed Vitals:   05/11/16 1552 05/11/16 2117 05/12/16 0230 05/12/16  0604  BP: 161/52 179/92 155/78 178/64  Pulse: 90 83 79 74  Temp: 98.8 F (37.1 C) 97.8 F (36.6 C)  98.4 F (36.9 C)  TempSrc:    Oral  Resp: Height:      Weight:      SpO2: 100% 100%  99%    Wt Readings from Last 3 Encounters:  05/11/16 53.7 kg (118 lb 6.2 oz)  04/30/16 68.04 kg (150 lb)     Intake/Output Summary (Last 24 hours) at 05/12/16 1143 Last data filed at 05/12/16 0700  Gross per 24 hour  Intake 1308.75 ml  Output      0 ml  Net 1308.75 ml     Physical Exam  Somnolent and unable to cooperate in exam runs a questions, moves all 4 extremities [place].AT,PERRAL Supple Neck,No JVD, No cervical lymphadenopathy appriciated.  Symmetrical Chest wall movement, Good air movement bilaterally, CTAB RRR,No Gallops,Rubs or new Murmurs, No Parasternal Heave +ve B.Sounds, Abd Soft, No tenderness, No organomegaly appriciated, No rebound - guarding or rigidity. No Cyanosis, Clubbing or edema, No new Rash or bruise      Data Review:    CBC  Recent Labs Lab 05/10/16 2129 05/11/16 1012 05/12/16 0541  WBC 11.5* 11.9* 9.9  HGB 6.5* 10.0* 9.7*  HCT 21.1* 31.2* 30.4*  PLT 216 237 253  MCV 81.8 83.0 82.6  MCH 25.2* 26.6 26.4  MCHC 30.8 32.1 31.9  RDW 16.2* 15.4 15.9*  LYMPHSABS 1.8  --   --   MONOABS 0.7  --   --   EOSABS 0.1  --   --   BASOSABS 0.0  --   --  Chemistries   Recent Labs Lab 05/10/16 2129 05/11/16 1012 05/12/16 0541 05/12/16 1019  NA 146* 147* 142 139  K 4.5 4.9 4.1 4.6  CL 116* 117* 111 114*  CO2 19* 18* 18* 17*  GLUCOSE 149* 136* 131* 182*  BUN 103* 92* 71* 67*  CREATININE 2.71* 2.25* 1.79* 1.70*  CALCIUM 8.8* 9.0 8.9 8.6*  MG  --   --  2.1  --   AST 38 38  --  40  ALT 27 27  --  23  ALKPHOS 70 75  --  58  BILITOT 0.5 0.8  --  0.7   ------------------------------------------------------------------------------------------------------------------ No results for input(s): CHOL, HDL, LDLCALC, TRIG, CHOLHDL, LDLDIRECT in the  last 72 hours.  No results found for: HGBA1C ------------------------------------------------------------------------------------------------------------------ No results for input(s): TSH, T4TOTAL, T3FREE, THYROIDAB in the last 72 hours.  Invalid input(s): FREET3 ------------------------------------------------------------------------------------------------------------------  Recent Labs  05/11/16 1035  VITAMINB12 679  FOLATE 31.0  FERRITIN 141  TIBC 207*  IRON 48  RETICCTPCT 1.5    Coagulation profile No results for input(s): INR, PROTIME in the last 168 hours.  No results for input(s): DDIMER in the last 72 hours.  Cardiac Enzymes No results for input(s): CKMB, TROPONINI, MYOGLOBIN in the last 168 hours.  Invalid input(s): CK ------------------------------------------------------------------------------------------------------------------    Component Value Date/Time   BNP 262.5* 04/01/2016 1442    Micro Results Recent Results (from the past 240 hour(s))  Urine culture     Status: Abnormal   Collection Time: 05/10/16 11:21 PM  Result Value Ref Range Status   Specimen Description URINE, CATHETERIZED  Final   Special Requests NONE  Final   Culture >=100,000 COLONIES/mL LACTOBACILLUS SPECIES (A)  Final   Report Status 05/12/2016 FINAL  Final  MRSA PCR Screening     Status: None   Collection Time: 05/11/16 12:52 AM  Result Value Ref Range Status   MRSA by PCR NEGATIVE NEGATIVE Final    Comment:        The GeneXpert MRSA Assay (FDA approved for NASAL specimens only), is one component of a comprehensive MRSA colonization surveillance program. It is not intended to diagnose MRSA infection nor to guide or monitor treatment for MRSA infections.     Radiology Reports Dg Chest 2 View  04/30/2016  CLINICAL DATA:  Altered mental status.  History of dementia. EXAM: CHEST  2 VIEW COMPARISON:  04/01/2016. FINDINGS: The cardiac silhouette remains mildly enlarged.  Normal vascularity. Clear lungs with minimal diffuse peribronchial thickening. Diffuse osteopenia. Bilateral shoulder degenerative changes and superior migration of the humeral heads, compatible chronic rotator cuff tears. Thoracic spine degenerative changes. IMPRESSION: 1. No acute abnormality. 2. Stable mild cardiomegaly. 3. Minimal chronic bronchitic changes. Electronically Signed   By: Beckie Salts M.D.   On: 04/30/2016 14:37   Ct Head Wo Contrast  05/11/2016  CLINICAL DATA:  Acute encephalopathy. History of hypertension and diabetes. EXAM: CT HEAD WITHOUT CONTRAST TECHNIQUE: Contiguous axial images were obtained from the base of the skull through the vertex without intravenous contrast. COMPARISON:  04/30/2016; 07/04/2015 FINDINGS: Brain: Re- demonstrated sequela of prior left MCA distribution infarct (image 18, series 2). Similar findings of atrophy with diffuse sulcal prominence. Extensive periventricular hypodensities compatible microvascular ischemic disease. Old lacunar infarcts within the left insular cortex (image 14, series 2), right basal ganglia (image 15) and right cerebellar hemisphere (image 9). Given extensive background parenchymal abnormalities, there is no CT evidence of superimposed acute large territory infarct. No intraparenchymal or extra-axial mass or hemorrhage. Unchanged  size and configuration of the ventricles and basilar cisterns. No midline shift. Vascular: Intracranial atherosclerosis Skull: Negative for fracture or focal lesion. Sinuses/Orbits: Post bilateral cataract surgery. There is underpneumatization the bilateral frontal sinuses. The remaining paranasal sinuses and mastoid air cells are normally aerated. Other: Regional soft tissues appear normal. IMPRESSION: 1. No acute intracranial process. 2. Similar findings of atrophy, prior left MCA distribution infarct and microvascular ischemic disease. Electronically Signed   By: Simonne Come M.D.   On: 05/11/2016 08:17   Ct Head  Wo Contrast  04/30/2016  CLINICAL DATA:  Altered mental status and history of dementia. EXAM: CT HEAD WITHOUT CONTRAST TECHNIQUE: Contiguous axial images were obtained from the base of the skull through the vertex without intravenous contrast. COMPARISON:  07/04/2015. FINDINGS: Stable advanced small vessel ischemic changes in the periventricular white matter. The brain demonstrates no evidence of hemorrhage, infarction, edema, mass effect, extra-axial fluid collection, hydrocephalus or mass lesion. The skull is unremarkable and shows no evidence of fracture. IMPRESSION: Stable chronic advanced small vessel ischemic disease. No acute findings. Electronically Signed   By: Irish Lack M.D.   On: 04/30/2016 15:00   Dg Abd Portable 1v  05/12/2016  CLINICAL DATA:  Severe abdominal pain EXAM: PORTABLE ABDOMEN - 1 VIEW COMPARISON:  CT abdomen pelvis dated 08/31/2014 FINDINGS: No evidence of bowel obstruction. Moderate colonic stool burden with mild to moderate stool in the rectum. Degenerative changes of the lower lumbar spine. IMPRESSION: Moderate colonic stool burden with mild to moderate stool in the rectum, suggesting constipation. Electronically Signed   By: Charline Bills M.D.   On: 05/12/2016 11:26    Time Spent in minutes  30   Lyndol Vanderheiden K M.D on 05/12/2016 at 11:43 AM  Between 7am to 7pm - Pager - [phone]  After 7pm go to [email or web address] - password [facility]  [facility] -  Office  [phone]

## 2016-05-12 NOTE — Progress Notes (Signed)
Pt had 5 beat run of V-tach no s/s, pt has been having frequent PVC's MD notified, will continue to monitor, Thanks Lavonda JumboMike F RN

## 2016-05-12 NOTE — Progress Notes (Signed)
BLADDER SCAN   AMOUNT: 227  DUE TO: INCREASED ABDOMINAL PAIN

## 2016-05-12 NOTE — Evaluation (Signed)
Clinical/Bedside Swallow Evaluation Patient Details  Name: Kelly Pratt MRN: [id number] Date of Birth: 12-Mar-1925  Today's Date: 05/12/2016 Time: SLP Start Time (ACUTE ONLY): 0854 SLP Stop Time (ACUTE ONLY): 0918 SLP Time Calculation (min) (ACUTE ONLY): 24 min  Past Medical History:  Past Medical History  Diagnosis Date  . Diabetes mellitus without complication (HCC)   . Hypertension    Past Surgical History:  Past Surgical History  Procedure Laterality Date  . Abdominal hysterectomy     HPI:  Kelly Pratt is a 82 y.o. female with medical history significant of dementia, hypertension, diabetes mellitus, chronic anemia, chronic kidney disease stage IV was brought to the ER from nursing home after patient was found to be increasingly lethargic and confused from baseline. Found to have UTI. CT head was negative. CXR during 04/30/16 admission no acute abnormality.. No prior ST notes   Assessment / Plan / Recommendation Clinical Impression  Pt exhibited intermittent oral holding and delayed transit (primarily with pills crushed in applesauec). RN reported pt holding during prior shift. No cough, throat clear or wet vocal quality although dementia increases aspiration risk. Recommend Dys 2 texture, thin liquids, straws allowed, pills crushed, small sips, check oral cavity at end of meals and full supervision/assist. ST will follow up.       Aspiration Risk   (mod-severe)    Diet Recommendation Dysphagia 2 (Fine chop);Thin liquid   Liquid Administration via: Cup;Straw Medication Administration: Crushed with puree Supervision: Patient able to self feed;Full supervision/cueing for compensatory strategies Compensations: Slow rate;Small sips/bites;Lingual sweep for clearance of pocketing Postural Changes: Seated upright at 90 degrees    Other  Recommendations Oral Care Recommendations: Oral care BID   Follow up Recommendations   (TBD)    Frequency and Duration min 2x/week  2 weeks        Prognosis Prognosis for Safe Diet Advancement:  (fair) Barriers to Reach Goals: Cognitive deficits      Swallow Study   General HPI: Kelly Pratt is a 82 y.o. female with medical history significant of dementia, hypertension, diabetes mellitus, chronic anemia, chronic kidney disease stage IV was brought to the ER from nursing home after patient was found to be increasingly lethargic and confused from baseline. Found to have UTI. CT head was negative. CXR during 04/30/16 admission no acute abnormality.. No prior ST notes Type of Study: Bedside Swallow Evaluation Previous Swallow Assessment:  (none) Diet Prior to this Study: NPO Temperature Spikes Noted: No Respiratory Status: Room air History of Recent Intubation: No Behavior/Cognition: Alert;Cooperative;Pleasant mood;Requires cueing;Distractible Oral Cavity Assessment: Within Functional Limits Oral Care Completed by SLP: No Oral Cavity - Dentition: Adequate natural dentition (missing several) Vision: Functional for self-feeding Self-Feeding Abilities: Needs set up;Needs assist;Able to feed self Patient Positioning: Upright in bed Baseline Vocal Quality: Low vocal intensity Volitional Cough: Weak Volitional Swallow: Unable to elicit    Oral/Motor/Sensory Function Overall Oral Motor/Sensory Function: Generalized oral weakness   Ice Chips Ice chips: Not tested   Thin Liquid Thin Liquid: Impaired Presentation: Cup;Straw Oral Phase Functional Implications: Prolonged oral transit Pharyngeal  Phase Impairments: Suspected delayed Swallow    Nectar Thick Nectar Thick Liquid: Not tested   Honey Thick Honey Thick Liquid: Not tested   Puree Puree: Impaired Oral Phase Impairments: Reduced lingual movement/coordination Oral Phase Functional Implications: Prolonged oral transit;Oral holding Pharyngeal Phase Impairments: Suspected delayed Swallow   Solid   GO   Solid: Impaired Oral Phase Functional Implications: Oral residue (minimal)  Kelly Pratt, Kelly Pratt 05/12/2016,10:05 AM   Kelly Pratt M.Ed [employer] Pager [phone]

## 2016-05-12 NOTE — Evaluation (Signed)
Physical Therapy Evaluation Patient Details Name: Kelly Pratt MRN: [id number] DOB: 1925/02/04 Today's Date: 05/12/2016   History of Present Illness  82 y.o. female with medical history significant of advanced dementia, hypertension, diabetes mellitus, chronic anemia, chronic kidney disease stage IV was brought to the ER from an assisted living facility after patient was found to be increasingly lethargic, in the ER found to have UTI, dehydration, acute renal failure along with encephalopathy   Clinical Impression  Patient very limited in mobility at this time. Patient presents with dependencies in transfers and gait and pain in LE's decreasing function.  Patient was ambulatory up until 3 weeks ago.  Feel patient would benefit from a trial of PT to increase mobility and function.  Unsure if ALF can provide the level of care that patient currently needs.  If not, patient will need SNF.      Follow Up Recommendations SNF    Equipment Recommendations  None recommended by PT    Recommendations for Other Services       Precautions / Restrictions Precautions Precautions: Fall      Mobility  Bed Mobility Overal bed mobility: Needs Assistance Bed Mobility: Supine to Sit;Sit to Supine     Supine to sit: Total assist Sit to supine: Total assist   General bed mobility comments: able to sit EOB with supervision   Transfers                    Ambulation/Gait                Stairs            Wheelchair Mobility    Modified Rankin (Stroke Patients Only)       Balance Overall balance assessment: Needs assistance Sitting-balance support: No upper extremity supported Sitting balance-Leahy Scale: Fair Sitting balance - Comments: unable to test shifting weight as patient unable to follow commands.  Was able to maintain sitting balance with challenges.                                     Pertinent Vitals/Pain Pain Assessment: Faces Faces Pain  Scale: Hurts little more Pain Location: LE's, right more than left Pain Intervention(s): Limited activity within patient's tolerance;Monitored during session    Home Living Family/patient expects to be discharged to:: Assisted living               Home Equipment: Walker - 2 wheels      Prior Function Level of Independence: Needs assistance   Gait / Transfers Assistance Needed: 3 weeks ago, pt was ambulatory with RW.             Hand Dominance        Extremity/Trunk Assessment   Upper Extremity Assessment: RUE deficits/detail;LUE deficits/detail RUE Deficits / Details: noted hand contractures, right > left; unable to assess UE's due to resistance to movement     LUE Deficits / Details: noted hand contractures, right > left; unable to assess UE's due to resistance to movement   Lower Extremity Assessment: RLE deficits/detail;LLE deficits/detail RLE Deficits / Details: ankle ROM WFL, knee flex wfl, knee extension - limited, unable to reach full extension LLE Deficits / Details: ankle ROM WFL, knee flex wfl, knee extension - limited, unable to reach full extension  Cervical / Trunk Assessment: Kyphotic  Communication   Communication: Expressive difficulties (does not speak much, just 1-2 words  at a time)  Cognition     Overall Cognitive Status: No family/caregiver present to determine baseline cognitive functioning (history of dementia, unsure if at baseline)                      General Comments      Exercises        Assessment/Plan    PT Assessment Patient needs continued PT services  PT Diagnosis Difficulty walking;Generalized weakness   PT Problem List Decreased strength;Decreased range of motion;Decreased activity tolerance;Decreased balance;Decreased mobility;Decreased cognition;Decreased knowledge of use of DME  PT Treatment Interventions DME instruction;Gait training;Functional mobility training;Therapeutic activities;Therapeutic  exercise;Patient/family education;Balance training   PT Goals (Current goals can be found in the Care Plan section) Acute Rehab PT Goals Patient Stated Goal: none stated PT Goal Formulation: Patient unable to participate in goal setting Time For Goal Achievement: 05/25/16 Potential to Achieve Goals: Fair    Frequency Min 2X/week   Barriers to discharge        Co-evaluation               End of Session   Activity Tolerance: Patient tolerated treatment well Patient left: in bed;with call bell/phone within reach;with bed alarm set           Time: [id number] PT Time Calculation (min) (ACUTE ONLY): 16 min   Charges:   PT Evaluation $PT Eval Moderate Complexity: 1 Procedure     PT G CodesOlivia Canter:        Reice Bienvenue M 05/12/2016, 4:58 PM 05/12/2016 Corlis HoveMargie Sherleen Pangborn, PT [phone]

## 2016-05-12 NOTE — Care Management Note (Addendum)
Case Management Note  Patient Details  Name: Kelly Pratt MRN: [id number] Date of Birth: 09/07/1925  Subjective/Objective:      Admitted with acute encephalopathy/UTI from ALF([facility]), history  of advanced dementia, hypertension, diabetes mellitus, chronic anemia, chronic kidney disease stage IV. Wheelchair bound prior to admit. Per daughter Eunice BlaseDebbie ALF provides help  with ADL's. WUJ:[id number]:Lisa Miller.  Action/Plan:  Palliative following to help establish goc. ALF vs SNF CM to f/u with disposition needs.  Expected Discharge Date:                  Expected Discharge Plan:  Assisted Living / Rest Home  In-House Referral:  Clinical Social Work  Discharge planning Services  CM Consult  Post Acute Care Choice:   Resumption of Svcs/PTA provider Choice offered to:     DME Arranged:    DME Agency:     HH Arranged:   RN,PT HH Agency:   [facility]  Status of Service:  In process, will continue to follow  Medicare Important Message Given:    Date Medicare IM Given:    Medicare IM give by:    Date Additional Medicare IM Given:    Additional Medicare Important Message give by:     If discussed at Long Length of Stay Meetings, dates discussed:    Additional Comments: Cipriano BunkerDolores Horsely  [phone], Geoffery SpruceDebbie Jones (Daughter) [phone]  Gae GallopCole, Ellamarie Naeve StanleyHudson, ArizonaRN,BSN,CM [phone] 05/12/2016, 9:55 AM

## 2016-05-13 LAB — GLUCOSE, CAPILLARY
GLUCOSE-CAPILLARY: 142 mg/dL — AB (ref 65–99)
GLUCOSE-CAPILLARY: 133 mg/dL — AB (ref 65–99)
GLUCOSE-CAPILLARY: 125 mg/dL — AB (ref 65–99)
GLUCOSE-CAPILLARY: 120 mg/dL — AB (ref 65–99)
Glucose-Capillary: 103 mg/dL — ABNORMAL HIGH (ref 65–99)

## 2016-05-13 NOTE — Clinical Social Work Note (Signed)
Clinical Social Work Assessment  Patient Details  Name: Kelly Pratt MRN: [id number] Date of Birth: May 25, 1925  Date of referral:  05/13/16               Reason for consult:  Facility Placement                Permission sought to share information with:  Facility Sport and exercise psychologist, Family Supports Permission granted to share information::  No (Patient disoriented; completed assessment with patient's daughters)  Name::     Sharyn Lull  Agency::  [facility] SNFs  Relationship::  Daughter  Contact Information:  [phone]  Housing/Transportation Living arrangements for the past 2 months:  [place] of Information:  Adult Children Patient Interpreter Needed:  None Criminal Activity/Legal Involvement Pertinent to Current Situation/Hospitalization:  No - Comment as needed Significant Relationships:  Adult Children Lives with:  Self Do you feel safe going back to the place where you live?  No Need for family participation in patient care:  Yes (Comment)  Care giving concerns:  CSW received referral for possible SNF placement at time of discharge. Patient is disoriented. CSW met with patient and patient's daughter at bedside regarding PT recommendation of SNF placement at time of discharge. Per patient's daughter, patient currently needs a higher level of care than the ALF she currently resides in ([place]). Patient's daughter expressed understanding of PT recommendation and is agreeable to SNF placement at time of discharge. CSW to continue to follow and assist with discharge planning needs.    Social Worker assessment / plan:  CSW spoke with patient's daughter concerning possibility of rehab at [facility] before returning home.  Employment status:  Retired Forensic scientist:  Medicare PT Recommendations:  [place] / Referral to community resources:  [place]  Patient/Family's Response to care:  Patient's  daughter recognizes need for rehab before returning home and is agreeable to a SNF in [place].   Patient/Family's Understanding of and Emotional Response to Diagnosis, Current Treatment, and Prognosis:  Patient/family is realistic regarding therapy needs. No questions/concerns about plan or treatment.    Emotional Assessment Appearance:  Appears stated age Attitude/Demeanor/Rapport:  Unable to Assess Affect (typically observed):  Unable to Assess Orientation:  Oriented to Self Alcohol / Substance use:  Not Applicable Psych involvement (Current and /or in the community):  No (Comment)  Discharge Needs  Concerns to be addressed:  Care Coordination Readmission within the last 30 days:  No Current discharge risk:  None Barriers to Discharge:  Continued Medical Work up   [employer], [place] 05/13/2016, 5:21 PM

## 2016-05-13 NOTE — Progress Notes (Signed)
Speech Language Pathology Treatment: Dysphagia  Patient Details Name: Kelly MormonHelen Pratt MRN: [id number] DOB: 11/11/1925 Today's Date: 05/13/2016 Time: [id number] SLP Time Calculation (min) (ACUTE ONLY): 8 min  Assessment / Plan / Recommendation Clinical Impression  F/u after yesterday's swallow evaluation.  Pt alert; consumed multiple sips of thin liquids with no s/s of aspiration, but persisting oral holding for up to 30 seconds before swallow is initiated. Max verbal cues needed to engage in swallow response.  Swallow behaviors are c/w advanced dementia.  Recommend continuing current diet (dys 2/thins) with assist for feeding and cues as need.  No family present.  F/u x 1 more for family education.     HPI HPI: Kelly MormonHelen Pratt is a 82 y.o. female with medical history significant of dementia, hypertension, diabetes mellitus, chronic anemia, chronic kidney disease stage IV was brought to the ER from nursing home after patient was found to be increasingly lethargic and confused from baseline. Found to have UTI. CT head was negative. CXR during 04/30/16 admission no acute abnormality.. No prior ST notes      SLP Plan  Continue with current plan of care     Recommendations  Diet recommendations: Thin liquid;Dysphagia 2 (fine chop) Liquids provided via: Cup;Straw Medication Administration: Crushed with puree Supervision: Staff to assist with self feeding Compensations: Slow rate;Small sips/bites;Lingual sweep for clearance of pocketing Postural Changes and/or Swallow Maneuvers: Seated upright 90 degrees             Oral Care Recommendations: Oral care BID Plan: Continue with current plan of care     GO               Kelly Pratt L. Kelly Fredericouture, [place] CCC/SLP Pager [phone]  Kelly MountsCouture, Kelly Pratt Kelly 05/13/2016, 3:52 PM

## 2016-05-13 NOTE — Progress Notes (Signed)
PROGRESS NOTE                                                                                                                                                                                                             Patient Demographics:    Kelly Pratt, is a 82 y.o. female, DOB - [birth year], ZOX:[id number]  Admit date - 05/10/2016   Admitting Physician Eduard Clos, MD  Outpatient Primary MD for the patient is Neldon Labella, MD  LOS - 2  Chief Complaint  Patient presents with  . Altered Mental Status       Brief Narrative   82 y.o. female with medical history significant of advanced dementia, hypertension, diabetes mellitus, chronic anemia, chronic kidney disease stage IV was brought to the ER from an assisted living facility after patient was found to be increasingly lethargic, in the ER found to have UTI, dehydration, acute renal failure along with encephalopathy and we were requested to admit the patient.   Subjective:    [name] today In bed, Pleasantly confused but denies any headache, no chest or abdominal pain today.   Assessment  & Plan :     1.Toxic encephalopathy on top of advanced dementia. Due to UTI, agree with empiric IV Rocephin, bladder scan shows less than 100 mL, continue gentle hydration, follow cultures. Speech therapy following currently on dysphagia 2 diet.  2. Advanced dementia. Discussed with daughter, at baseline poor functional status and mentation, supportive care, made DO NOT RESUSCITATE, for now continued donepezil along with risperidone.  3. Dyslipidemia on statin.  4. Hypertension. Currently on Norvasc, added beta blocker +  as needed hydralazine.  5. Chronic anemia. Required 2 units of packed RBC transfusion in ER, anemia panel was stable, Hemoccult negative. Outpatient  hematology follow-up. post transfusion H&H stable.  6. ARF on CK D stage IV. Baseline creatinine is 1.6, this is prerenal due to anemia, transfused, gentle hydration and monitor.  7. Dehydration with hyponatremia. Hydrated with D5W gently and monitor.  8. Constipation with stool burden and abdominal pain. Status post disimpaction and anima on 05/12/2016, on bowel regimen and now symptom-free.   9. DM type II. ISS.  CBG (last 3)   Recent Labs  05/12/16 2139 05/13/16 0415 05/13/16 0756  GLUCAP 158* 142* 133*      Code Status :  DO NOT RESUSCITATE  Family Communication  :  Discussed with daughter Eunice BlaseDebbie  Disposition Plan  :  SNF  Consults  : Palliative care  Procedures  :    Head CT nonacute  DVT Prophylaxis  :  Lovenox   Lab Results  Component Value Date   PLT 240 05/12/2016    Inpatient Medications  Scheduled Meds: . acetaminophen  500 mg Oral BID  . amLODipine  5 mg Oral Daily  . atorvastatin  20 mg Oral Daily  . bisacodyl  10 mg Oral Daily  . carvedilol  3.125 mg Oral BID WC  . cefTRIAXone (ROCEPHIN)  IV  1 g Intravenous Q24H  . docusate sodium  200 mg Oral BID  . donepezil  10 mg Oral QHS  . enoxaparin (LOVENOX) injection  30 mg Subcutaneous Q24H  . erythromycin   Both Eyes Q6H  . insulin aspart  0-9 Units Subcutaneous TID WC  . OLANZapine  5 mg Oral QHS  . omega-3 acid ethyl esters  1 g Oral Daily  . polyethylene glycol  17 g Oral BID   Continuous Infusions:  PRN Meds:.  Antibiotics  :    Anti-infectives    Start     Dose/Rate Route Frequency Ordered Stop   05/11/16 2200  cefTRIAXone (ROCEPHIN) 1 g in dextrose 5 % 50 mL IVPB     1 g 100 mL/hr over 30 Minutes Intravenous Every 24 hours 05/11/16 0113     05/10/16 2200  cefTRIAXone (ROCEPHIN) 1 g in dextrose 5 % 50 mL IVPB     1 g 100 mL/hr over 30 Minutes Intravenous  Once 05/10/16 2156 05/11/16 0002         Objective:   Filed Vitals:   05/12/16 1213 05/12/16 2148 05/13/16 0520 05/13/16  0730  BP: 131/52 165/60 163/52 140/47  Pulse: 71 73 77 74  Temp: 98.6 F (37 C) 97.8 F (36.6 C) 97.7 F (36.5 C)   TempSrc:   Oral   Resp: 16 19 17    Height:    5' (1.524 m)  Weight:    54.432 kg (120 lb)  SpO2: 100% 97% 99%     Wt Readings from Last 3 Encounters:  05/13/16 54.432 kg (120 lb)  04/30/16 68.04 kg (150 lb)     Intake/Output Summary (Last 24 hours) at 05/13/16 1104 Last data filed at 05/13/16 0946  Gross per 24 hour  Intake    100 ml  Output      0 ml  Net    100 ml     Physical Exam  Somnolent and unable to cooperate in exam runs a questions, moves all 4 extremities [place].AT,PERRAL Supple Neck,No JVD, No cervical lymphadenopathy appriciated.  Symmetrical Chest wall movement, Good air movement bilaterally, CTAB RRR,No Gallops,Rubs or new Murmurs, No Parasternal Heave +ve B.Sounds, Abd Soft, No tenderness, No organomegaly appriciated, No rebound - guarding or rigidity. No Cyanosis, Clubbing or edema, No new Rash or bruise      Data Review:    CBC  Recent Labs Lab 05/10/16 2129 05/11/16 1012 05/12/16 0541 05/12/16 1118  WBC 11.5* 11.9* 9.9 10.3  HGB 6.5* 10.0* 9.7* 10.0*  HCT 21.1* 31.2* 30.4* 32.0*  PLT 216 237 253 240  MCV 81.8 83.0 82.6 84.9  MCH 25.2* 26.6 26.4 26.5  MCHC 30.8 32.1 31.9 31.3  RDW 16.2* 15.4 15.9* 15.9*  LYMPHSABS 1.8  --   --   --   MONOABS 0.7  --   --   --  EOSABS 0.1  --   --   --   BASOSABS 0.0  --   --   --     Chemistries   Recent Labs Lab 05/10/16 2129 05/11/16 1012 05/12/16 0541 05/12/16 1019  NA 146* 147* 142 139  K 4.5 4.9 4.1 4.6  CL 116* 117* 111 114*  CO2 19* 18* 18* 17*  GLUCOSE 149* 136* 131* 182*  BUN 103* 92* 71* 67*  CREATININE 2.71* 2.25* 1.79* 1.70*  CALCIUM 8.8* 9.0 8.9 8.6*  MG  --   --  2.1  --   AST 38 38  --  40  ALT 27 27  --  23  ALKPHOS 70 75  --  58  BILITOT 0.5 0.8  --  0.7    ------------------------------------------------------------------------------------------------------------------ No results for input(s): CHOL, HDL, LDLCALC, TRIG, CHOLHDL, LDLDIRECT in the last 72 hours.  No results found for: HGBA1C ------------------------------------------------------------------------------------------------------------------ No results for input(s): TSH, T4TOTAL, T3FREE, THYROIDAB in the last 72 hours.  Invalid input(s): FREET3 ------------------------------------------------------------------------------------------------------------------  Recent Labs  05/11/16 1035  VITAMINB12 679  FOLATE 31.0  FERRITIN 141  TIBC 207*  IRON 48  RETICCTPCT 1.5    Coagulation profile  Recent Labs Lab 05/12/16 1118  INR 1.35    No results for input(s): DDIMER in the last 72 hours.  Cardiac Enzymes No results for input(s): CKMB, TROPONINI, MYOGLOBIN in the last 168 hours.  Invalid input(s): CK ------------------------------------------------------------------------------------------------------------------    Component Value Date/Time   BNP 262.5* 04/01/2016 1442    Micro Results Recent Results (from the past 240 hour(s))  Culture, blood (routine x 2)     Status: None (Preliminary result)   Collection Time: 05/10/16  9:29 PM  Result Value Ref Range Status   Specimen Description BLOOD LEFT FOREARM  Final   Special Requests BOTTLES DRAWN AEROBIC AND ANAEROBIC 5CC  Final   Culture NO GROWTH 1 DAY  Final   Report Status PENDING  Incomplete  Culture, blood (routine x 2)     Status: None (Preliminary result)   Collection Time: 05/10/16 10:10 PM  Result Value Ref Range Status   Specimen Description BLOOD RIGHT HAND  Final   Special Requests BOTTLES DRAWN AEROBIC AND ANAEROBIC 5CC  Final   Culture NO GROWTH 1 DAY  Final   Report Status PENDING  Incomplete  Urine culture     Status: Abnormal   Collection Time: 05/10/16 11:21 PM  Result Value Ref Range  Status   Specimen Description URINE, CATHETERIZED  Final   Special Requests NONE  Final   Culture >=100,000 COLONIES/mL LACTOBACILLUS SPECIES (A)  Final   Report Status 05/12/2016 FINAL  Final  MRSA PCR Screening     Status: None   Collection Time: 05/11/16 12:52 AM  Result Value Ref Range Status   MRSA by PCR NEGATIVE NEGATIVE Final    Comment:        The GeneXpert MRSA Assay (FDA approved for NASAL specimens only), is one component of a comprehensive MRSA colonization surveillance program. It is not intended to diagnose MRSA infection nor to guide or monitor treatment for MRSA infections.     Radiology Reports Dg Chest 2 View  04/30/2016  CLINICAL DATA:  Altered mental status.  History of dementia. EXAM: CHEST  2 VIEW COMPARISON:  04/01/2016. FINDINGS: The cardiac silhouette remains mildly enlarged. Normal vascularity. Clear lungs with minimal diffuse peribronchial thickening. Diffuse osteopenia. Bilateral shoulder degenerative changes and superior migration of the humeral heads, compatible chronic rotator cuff tears.  Thoracic spine degenerative changes. IMPRESSION: 1. No acute abnormality. 2. Stable mild cardiomegaly. 3. Minimal chronic bronchitic changes. Electronically Signed   By: Beckie Salts M.D.   On: 04/30/2016 14:37   Ct Head Wo Contrast  05/11/2016  CLINICAL DATA:  Acute encephalopathy. History of hypertension and diabetes. EXAM: CT HEAD WITHOUT CONTRAST TECHNIQUE: Contiguous axial images were obtained from the base of the skull through the vertex without intravenous contrast. COMPARISON:  04/30/2016; 07/04/2015 FINDINGS: Brain: Re- demonstrated sequela of prior left MCA distribution infarct (image 18, series 2). Similar findings of atrophy with diffuse sulcal prominence. Extensive periventricular hypodensities compatible microvascular ischemic disease. Old lacunar infarcts within the left insular cortex (image 14, series 2), right basal ganglia (image 15) and right cerebellar  hemisphere (image 9). Given extensive background parenchymal abnormalities, there is no CT evidence of superimposed acute large territory infarct. No intraparenchymal or extra-axial mass or hemorrhage. Unchanged size and configuration of the ventricles and basilar cisterns. No midline shift. Vascular: Intracranial atherosclerosis Skull: Negative for fracture or focal lesion. Sinuses/Orbits: Post bilateral cataract surgery. There is underpneumatization the bilateral frontal sinuses. The remaining paranasal sinuses and mastoid air cells are normally aerated. Other: Regional soft tissues appear normal. IMPRESSION: 1. No acute intracranial process. 2. Similar findings of atrophy, prior left MCA distribution infarct and microvascular ischemic disease. Electronically Signed   By: Simonne Come M.D.   On: 05/11/2016 08:17   Ct Head Wo Contrast  04/30/2016  CLINICAL DATA:  Altered mental status and history of dementia. EXAM: CT HEAD WITHOUT CONTRAST TECHNIQUE: Contiguous axial images were obtained from the base of the skull through the vertex without intravenous contrast. COMPARISON:  07/04/2015. FINDINGS: Stable advanced small vessel ischemic changes in the periventricular white matter. The brain demonstrates no evidence of hemorrhage, infarction, edema, mass effect, extra-axial fluid collection, hydrocephalus or mass lesion. The skull is unremarkable and shows no evidence of fracture. IMPRESSION: Stable chronic advanced small vessel ischemic disease. No acute findings. Electronically Signed   By: Irish Lack M.D.   On: 04/30/2016 15:00   Dg Abd Portable 1v  05/12/2016  CLINICAL DATA:  Severe abdominal pain EXAM: PORTABLE ABDOMEN - 1 VIEW COMPARISON:  CT abdomen pelvis dated 08/31/2014 FINDINGS: No evidence of bowel obstruction. Moderate colonic stool burden with mild to moderate stool in the rectum. Degenerative changes of the lower lumbar spine. IMPRESSION: Moderate colonic stool burden with mild to moderate stool  in the rectum, suggesting constipation. Electronically Signed   By: Charline Bills M.D.   On: 05/12/2016 11:26    Time Spent in minutes  30   Roizy Harold K M.D on 05/13/2016 at 11:04 AM  Between 7am to 7pm - Pager - [phone]  After 7pm go to [email or web address] - password [facility]  [facility] -  Office  [phone]

## 2016-05-13 NOTE — NC FL2 (Signed)
Kelly Pratt MEDICAID FL2 LEVEL OF CARE SCREENING TOOL     IDENTIFICATION  Patient Name: Kelly Pratt Birthdate: 1925/10/24 Sex: female Admission Date (Current Location): 05/10/2016  [facility] and [place] Number:  Producer, television/film/video and Address:  [facility], [address], [place], [place] [address]      Provider Number: [id number]  Attending Physician Name and Address:  Leroy Sea, MD  Relative Name and Phone Number:  Kelly Pratt, daughter, [phone]    Current Level of Care: Hospital Recommended Level of Care: Skilled Nursing Facility Prior Approval Number:    Date Approved/Denied:   PASRR Number: [id number]  Discharge Plan: SNF    Current Diagnoses: Patient Active Problem List   Diagnosis Date Noted  . Acute encephalopathy 05/11/2016  . Renal failure (ARF), acute on chronic (HCC) 05/11/2016  . Absolute anemia 05/11/2016  . UTI (lower urinary tract infection) 05/11/2016  . Dementia 05/11/2016  . Essential hypertension 05/11/2016  . ARF (acute renal failure) (HCC) 05/11/2016  . Palliative care encounter   . Acute renal insufficiency 05/10/2016    Orientation RESPIRATION BLADDER Height & Weight     Self  Normal Incontinent Weight: 54.432 kg (120 lb) Height:  5' (152.4 cm)  BEHAVIORAL SYMPTOMS/MOOD NEUROLOGICAL BOWEL NUTRITION STATUS      Incontinent Diet (Please See DC Summary)  AMBULATORY STATUS COMMUNICATION OF NEEDS Skin   Extensive Assist Verbally Normal                       Personal Care Assistance Level of Assistance  Bathing, Feeding, Dressing Bathing Assistance: Maximum assistance Feeding assistance: Limited assistance Dressing Assistance: Limited assistance     Functional Limitations Info             SPECIAL CARE FACTORS FREQUENCY  PT (By licensed PT)                    Contractures      Additional Factors Info  Code Status, Allergies, Insulin Sliding Scale, Psychotropic Code Status Info:  DNR Allergies Info: Codeine Psychotropic Info: Zyprexa Insulin Sliding Scale Info: insulin aspart (novoLOG) injection 0-9 Units       Current Medications (05/13/2016):  This is the current hospital active medication list Current Facility-Administered Medications  Medication Dose Route Frequency Provider Last Rate Last Dose  . acetaminophen (TYLENOL) tablet 500 mg  500 mg Oral Q6H PRN Irean Hong, NP   500 mg at 05/11/16 1757  . acetaminophen (TYLENOL) tablet 500 mg  500 mg Oral BID Irean Hong, NP   500 mg at 05/13/16 0915  . amLODipine (NORVASC) tablet 5 mg  5 mg Oral Daily Eduard Clos, MD   5 mg at 05/13/16 1114  . atorvastatin (LIPITOR) tablet 20 mg  20 mg Oral Daily Eduard Clos, MD   20 mg at 05/13/16 1113  . bisacodyl (DULCOLAX) EC tablet 10 mg  10 mg Oral Daily Leroy Sea, MD   10 mg at 05/13/16 1114  . carvedilol (COREG) tablet 3.125 mg  3.125 mg Oral BID WC Leroy Sea, MD   3.125 mg at 05/13/16 0915  . cefTRIAXone (ROCEPHIN) 1 g in dextrose 5 % 50 mL IVPB  1 g Intravenous Q24H Titus Mould, RPH   1 g at 05/12/16 2128  . docusate sodium (COLACE) capsule 200 mg  200 mg Oral BID Leroy Sea, MD   200 mg at 05/13/16 1113  .  donepezil (ARICEPT) tablet 10 mg  10 mg Oral QHS Eduard ClosArshad N Kakrakandy, MD   10 mg at 05/12/16 2110  . enoxaparin (LOVENOX) injection 30 mg  30 mg Subcutaneous Q24H Eduard ClosArshad N Kakrakandy, MD   30 mg at 05/13/16 1113  . erythromycin ophthalmic ointment   Both Eyes Q6H Leroy SeaPrashant K Singh, MD      . hydrALAZINE (APRESOLINE) injection 10 mg  10 mg Intravenous Q6H PRN Leroy SeaPrashant K Singh, MD   10 mg at 05/13/16 0525  . insulin aspart (novoLOG) injection 0-9 Units  0-9 Units Subcutaneous TID WC Eduard ClosArshad N Kakrakandy, MD   1 Units at 05/13/16 0915  . morphine 2 MG/ML injection 2 mg  2 mg Intravenous Q4H PRN Leroy SeaPrashant K Singh, MD   2 mg at 05/12/16 1006  . OLANZapine (ZYPREXA) tablet 5 mg  5 mg Oral QHS Irean HongSarah Grace Bullard, NP   5 mg at  05/12/16 2109  . omega-3 acid ethyl esters (LOVAZA) capsule 1 g  1 g Oral Daily Eduard ClosArshad N Kakrakandy, MD   1 g at 05/13/16 1114  . polyethylene glycol (MIRALAX / GLYCOLAX) packet 17 g  17 g Oral BID Leroy SeaPrashant K Singh, MD   17 g at 05/13/16 1113     Discharge Medications: Please see discharge summary for a list of discharge medications.  Relevant Imaging Results:  Relevant Lab Results:   Additional Information SSN: [id number]  Kelly Pratt S HixtonRayyan, [place]WA

## 2016-05-14 LAB — GLUCOSE, CAPILLARY: Glucose-Capillary: 134 mg/dL — ABNORMAL HIGH (ref 65–99)

## 2016-05-14 MED ORDER — INSULIN ASPART 100 UNIT/ML ~~LOC~~ SOLN: SUBCUTANEOUS | Status: AC

## 2016-05-14 MED ORDER — DOCUSATE SODIUM 100 MG PO CAPS: 100.0000 mg | ORAL_CAPSULE | Freq: Two times a day (BID) | ORAL | Status: AC

## 2016-05-14 MED ORDER — CARVEDILOL 3.125 MG PO TABS: 3.1250 mg | ORAL_TABLET | Freq: Two times a day (BID) | ORAL | Status: AC

## 2016-05-14 MED ORDER — HYDROCODONE-ACETAMINOPHEN 5-325 MG PO TABS: 1.0000 | ORAL_TABLET | Freq: Four times a day (QID) | ORAL | Status: AC | PRN

## 2016-05-14 MED ORDER — ERYTHROMYCIN 5 MG/GM OP OINT
TOPICAL_OINTMENT | Freq: Four times a day (QID) | OPHTHALMIC | Status: AC
Start: 2016-05-14 — End: ?

## 2016-05-14 NOTE — Progress Notes (Signed)
Patient will discharge to [facility]  Anticipated discharge date: 5/23 Family notified:Debbie (dtr), left message for [employer] by [employer]- called at 10:50am  CSW signing off.  Merlyn LotJenna Holoman, LCSWA Clinical Social Worker [phone]

## 2016-05-14 NOTE — Consult Note (Signed)
SLP Cancellation Note  Patient Details Name: Kelly MormonHelen Bardales MRN: [id number] DOB: 08/03/1925   Cancelled treatment:       Reason Eval/Treat Not Completed: Fatigue/lethargy limiting ability to participate. Pt very lethargic this morning. Unable to arouse sufficiently for assessment of PO tolerance, despite verbal and tactile stim, and cold washcloth. Will continue to follow for diet tolerance assessment and eduction.  Leigh AuroraBueche, Celia Brown 05/14/2016, 9:49 AM  Merlene Laughterelia B. Murvin NatalBueche, [facility] C.F.S.E.MSP, CCC-SLP [phone]

## 2016-05-14 NOTE — Discharge Instructions (Signed)
Follow with Primary MD Neldon LabellaMILLER,LISA LYNN, MD in 7 days   Get CBC, CMP, 2 view Chest X ray checked  by Primary MD next visit.    Activity: As tolerated with Full fall precautions use walker/cane & assistance as needed   Disposition SNF   Diet:   Dysphagia 2  with feeding assistance and aspiration precautions.  For Heart failure patients - Check your Weight same time everyday, if you gain over 2 pounds, or you develop in leg swelling, experience more shortness of breath or chest pain, call your Primary MD immediately. Follow Cardiac Low Salt Diet and 1.5 lit/day fluid restriction.   On your next visit with your primary care physician please Get Medicines reviewed and adjusted.   Please request your Prim.MD to go over all Hospital Tests and Procedure/Radiological results at the follow up, please get all Hospital records sent to your Prim MD by signing hospital release before you go home.   If you experience worsening of your admission symptoms, develop shortness of breath, life threatening emergency, suicidal or homicidal thoughts you must seek medical attention immediately by calling 911 or calling your MD immediately  if symptoms less severe.  You Must read complete instructions/literature along with all the possible adverse reactions/side effects for all the Medicines you take and that have been prescribed to you. Take any new Medicines after you have completely understood and accpet all the possible adverse reactions/side effects.   Do not drive, operate heavy machinery, perform activities at heights, swimming or participation in water activities or provide baby sitting services if your were admitted for syncope or siezures until you have seen by Primary MD or a Neurologist and advised to do so again.  Do not drive when taking Pain medications.    Do not take more than prescribed Pain, Sleep and Anxiety Medications  Special Instructions: If you have smoked or chewed Tobacco  in the  last 2 yrs please stop smoking, stop any regular Alcohol  and or any Recreational drug use.  Wear Seat belts while driving.   Please note  You were cared for by a hospitalist during your hospital stay. If you have any questions about your discharge medications or the care you received while you were in the hospital after you are discharged, you can call the unit and asked to speak with the hospitalist on call if the hospitalist that took care of you is not available. Once you are discharged, your primary care physician will handle any further medical issues. Please note that NO REFILLS for any discharge medications will be authorized once you are discharged, as it is imperative that you return to your primary care physician (or establish a relationship with a primary care physician if you do not have one) for your aftercare needs so that they can reassess your need for medications and monitor your lab values.

## 2016-05-14 NOTE — Care Management Note (Signed)
Case Management Note  Patient Details  Name: Kelly MormonHelen Pratt MRN: [id number] Date of Birth: 12/28/1924  Subjective/Objective:                    Action/Plan: Plan is to d/c to SNF today.  Expected Discharge Date:    05/14/2016           Expected Discharge Plan:  SNF/ [facility]  In-House Referral:  Clinical Social Work  Discharge planning Services  CM Consult   Status of Service:  Completed, signed off  Epifanio LeschesCole, Jashay Roddy Hudson, [place]RN 05/14/2016, 10:06 AM

## 2016-05-14 NOTE — Care Management Important Message (Signed)
Important Message  Patient Details  Name: Kelly MormonHelen Dooley MRN: [id number] Date of Birth: 05/16/1925   Medicare Important Message Given:  Yes    [name] 05/14/2016, 10:36 AM

## 2016-05-14 NOTE — Discharge Summary (Signed)
Kelly MormonHelen Pratt, is a 82 y.o. female  DOB 05/13/1925  MRN [id number].  Admission date:  05/10/2016  Admitting Physician  Eduard ClosArshad N Kakrakandy, MD  Discharge Date:  05/14/2016   Primary MD  Neldon LabellaMILLER,LISA LYNN, MD  Recommendations for primary care physician for things to follow:   Check CBC, BMP in 1 week  follow final Urine culture results  Admission Diagnosis  UTI (lower urinary tract infection) [N39.0] Acute renal insufficiency [N28.9] Anemia, unspecified anemia type [D64.9]   Discharge Diagnosis  UTI (lower urinary tract infection) [N39.0] Acute renal insufficiency [N28.9] Anemia, unspecified anemia type [D64.9]     Principal Problem:   Acute encephalopathy Active Problems:   Renal failure (ARF), acute on chronic (HCC)   Absolute anemia   UTI (lower urinary tract infection)   Dementia   Essential hypertension   Palliative care encounter   ARF (acute renal failure) (HCC)      Past Medical History  Diagnosis Date  . Diabetes mellitus without complication (HCC)   . Hypertension     Past Surgical History  Procedure Laterality Date  . Abdominal hysterectomy         HPI  from the history and physical done on the day of admission:   82 y.o. female with medical history significant of advanced dementia, hypertension, diabetes mellitus, chronic anemia, chronic kidney disease stage IV was brought to the ER from an assisted living facility after patient was found to be increasingly lethargic, in the ER found to have UTI, dehydration, acute renal failure along with encephalopathy and we were requested to admit the patient.     Hospital Course:     1.Toxic encephalopathy on top of advanced dementia. Due to UTI,  Treated with empiric IV Rocephin and clinically better, bladder scan showede less than 100 mL,  post gentle hydration, follow final Urine culture results. Speech therapy following currently on dysphagia 2 diet.  2. Advanced dementia. Discussed with daughter, at baseline poor functional status and mentation, supportive care, made DO NOT RESUSCITATE, for now continued donepezil along with risperidone.  3. Dyslipidemia on statin.  4. Hypertension. Currently on Norvasc, added beta blocker .  5. Chronic anemia. Required 2 units of packed RBC transfusion in ER, anemia panel was stable, Hemoccult negative. Outpatient hematology follow-up. post transfusion H&H stable.  6. ARF on CK D stage IV. Baseline creatinine is 1.6, this is prerenal due to anemia, transfused, gentle hydration and stable, repeat BMP in 1 week.  7. Dehydration with hyponatremia. Hydrated with D5W gently and stable  8. Constipation with stool burden and abdominal pain. Status post disimpaction and anima on 05/12/2016, on bowel regimen and now symptom-free.   9. DM type II. ISS. Stopped Glucophage.  CBG (last 3)   Recent Labs  05/13/16 1703 05/13/16 2120 05/14/16 0811  GLUCAP 125* 120* 134*    Follow UP  Follow-up Information    Follow up with Neldon LabellaMILLER,LISA LYNN, MD. Schedule an appointment as soon as possible for a visit in 1 week.   Specialty:  Family Medicine   Contact information:   [address] [place] [address] [phone]        Consults obtained -  Palliative care  Discharge Condition: Fair  Diet and Activity recommendation: See Discharge Instructions below  Discharge Instructions           Discharge Instructions    Discharge instructions    Complete by:  As directed   Follow with Primary MD Neldon Labella, MD in 7 days   Get CBC, CMP, 2 view Chest X ray checked  by Primary MD next visit.    Activity: As tolerated with Full fall precautions use walker/cane & assistance as needed   Disposition SNF   Diet:   Dysphagia 2  with feeding assistance and aspiration  precautions.  For Heart failure patients - Check your Weight same time everyday, if you gain over 2 pounds, or you develop in leg swelling, experience more shortness of breath or chest pain, call your Primary MD immediately. Follow Cardiac Low Salt Diet and 1.5 lit/day fluid restriction.   On your next visit with your primary care physician please Get Medicines reviewed and adjusted.   Please request your Prim.MD to go over all Hospital Tests and Procedure/Radiological results at the follow up, please get all Hospital records sent to your Prim MD by signing hospital release before you go home.   If you experience worsening of your admission symptoms, develop shortness of breath, life threatening emergency, suicidal or homicidal thoughts you must seek medical attention immediately by calling 911 or calling your MD immediately  if symptoms less severe.  You Must read complete instructions/literature along with all the possible adverse reactions/side effects for all the Medicines you take and that have been prescribed to you. Take any new Medicines after you have completely understood and accpet all the possible adverse reactions/side effects.   Do not drive, operate heavy machinery, perform activities at heights, swimming or participation in water activities or provide baby sitting services if your were admitted for syncope or siezures until you have seen by Primary MD or a Neurologist and advised to do so again.  Do not drive when taking Pain medications.    Do not take more than prescribed Pain, Sleep and Anxiety Medications  Special Instructions: If you have smoked or chewed Tobacco  in the last 2 yrs please stop smoking, stop any regular Alcohol  and or any Recreational drug use.  Wear Seat belts while driving.   Please note  You were cared for by a hospitalist during your hospital stay. If you have any questions about your discharge medications or the care you received while you were  in the hospital after you are discharged, you can call the unit and asked to speak with the hospitalist on call if the hospitalist that took care of you is not available. Once you are discharged, your primary care physician will handle any further medical issues. Please note that NO REFILLS for any discharge medications will be authorized once you are discharged, as it is imperative that you return to your primary care physician (or establish a relationship with a primary care physician if you do not have one) for your aftercare needs so that they can reassess your need for medications and monitor your lab values     Increase activity slowly    Complete by:  As directed              Discharge Medications  Medication List    STOP taking these medications        metFORMIN 500 MG tablet  Commonly known as:  GLUCOPHAGE      TAKE these medications        acetaminophen 325 MG tablet  Commonly known as:  TYLENOL  Take 650 mg by mouth 2 (two) times daily.     amLODipine 5 MG tablet  Commonly known as:  NORVASC  Take 5 mg by mouth daily.     atorvastatin 20 MG tablet  Commonly known as:  LIPITOR  Take 20 mg by mouth daily.     carvedilol 3.125 MG tablet  Commonly known as:  COREG  Take 1 tablet (3.125 mg total) by mouth 2 (two) times daily with a meal.     docusate sodium 100 MG capsule  Commonly known as:  COLACE  Take 1 capsule (100 mg total) by mouth 2 (two) times daily.     donepezil 5 MG tablet  Commonly known as:  ARICEPT  Take 10 mg by mouth at bedtime.     erythromycin ophthalmic ointment  Place into both eyes every 6 (six) hours.     hydrochlorothiazide 12.5 MG capsule  Commonly known as:  MICROZIDE  Take 12.5 mg by mouth daily.     HYDROcodone-acetaminophen 5-325 MG tablet  Commonly known as:  NORCO/VICODIN  Take 1 tablet by mouth every 6 (six) hours as needed for moderate pain.     insulin aspart 100 UNIT/ML injection  Commonly known as:  NOVOLOG    Before each meal 3 times a day, 140-199 - 2 units, 200-250 - 4 units, 251-299 - 6 units,  300-349 - 8 units,  350 or above 10 units. Dispense syringes and needles as needed, Ok to switch to PEN if approved. Substitute to any brand approved. DX DM2, Code E11.65     multivitamin with minerals Tabs tablet  Take 1 tablet by mouth daily.     OLANZapine 2.5 MG tablet  Commonly known as:  ZYPREXA  Take 2.5 mg by mouth at bedtime.     omega-3 acid ethyl esters 1 g capsule  Commonly known as:  LOVAZA  Take 1 g by mouth daily.     risperiDONE 0.25 MG tablet  Commonly known as:  RISPERDAL  Take 0.25 mg by mouth at bedtime.        Major procedures and Radiology Reports - PLEASE review detailed and final reports for all details, in brief -    Head CT nonacute   Dg Chest 2 View  04/30/2016  CLINICAL DATA:  Altered mental status.  History of dementia. EXAM: CHEST  2 VIEW COMPARISON:  04/01/2016. FINDINGS: The cardiac silhouette remains mildly enlarged. Normal vascularity. Clear lungs with minimal diffuse peribronchial thickening. Diffuse osteopenia. Bilateral shoulder degenerative changes and superior migration of the humeral heads, compatible chronic rotator cuff tears. Thoracic spine degenerative changes. IMPRESSION: 1. No acute abnormality. 2. Stable mild cardiomegaly. 3. Minimal chronic bronchitic changes. Electronically Signed   By: Beckie Salts M.D.   On: 04/30/2016 14:37   Ct Head Wo Contrast  05/11/2016  CLINICAL DATA:  Acute encephalopathy. History of hypertension and diabetes. EXAM: CT HEAD WITHOUT CONTRAST TECHNIQUE: Contiguous axial images were obtained from the base of the skull through the vertex without intravenous contrast. COMPARISON:  04/30/2016; 07/04/2015 FINDINGS: Brain: Re- demonstrated sequela of prior left MCA distribution infarct (image 18, series 2). Similar findings of atrophy with diffuse sulcal prominence. Extensive periventricular hypodensities compatible  microvascular  ischemic disease. Old lacunar infarcts within the left insular cortex (image 14, series 2), right basal ganglia (image 15) and right cerebellar hemisphere (image 9). Given extensive background parenchymal abnormalities, there is no CT evidence of superimposed acute large territory infarct. No intraparenchymal or extra-axial mass or hemorrhage. Unchanged size and configuration of the ventricles and basilar cisterns. No midline shift. Vascular: Intracranial atherosclerosis Skull: Negative for fracture or focal lesion. Sinuses/Orbits: Post bilateral cataract surgery. There is underpneumatization the bilateral frontal sinuses. The remaining paranasal sinuses and mastoid air cells are normally aerated. Other: Regional soft tissues appear normal. IMPRESSION: 1. No acute intracranial process. 2. Similar findings of atrophy, prior left MCA distribution infarct and microvascular ischemic disease. Electronically Signed   By: Simonne Come M.D.   On: 05/11/2016 08:17   Ct Head Wo Contrast  04/30/2016  CLINICAL DATA:  Altered mental status and history of dementia. EXAM: CT HEAD WITHOUT CONTRAST TECHNIQUE: Contiguous axial images were obtained from the base of the skull through the vertex without intravenous contrast. COMPARISON:  07/04/2015. FINDINGS: Stable advanced small vessel ischemic changes in the periventricular white matter. The brain demonstrates no evidence of hemorrhage, infarction, edema, mass effect, extra-axial fluid collection, hydrocephalus or mass lesion. The skull is unremarkable and shows no evidence of fracture. IMPRESSION: Stable chronic advanced small vessel ischemic disease. No acute findings. Electronically Signed   By: Irish Lack M.D.   On: 04/30/2016 15:00   Dg Abd Portable 1v  05/12/2016  CLINICAL DATA:  Severe abdominal pain EXAM: PORTABLE ABDOMEN - 1 VIEW COMPARISON:  CT abdomen pelvis dated 08/31/2014 FINDINGS: No evidence of bowel obstruction. Moderate colonic stool burden with mild to  moderate stool in the rectum. Degenerative changes of the lower lumbar spine. IMPRESSION: Moderate colonic stool burden with mild to moderate stool in the rectum, suggesting constipation. Electronically Signed   By: Charline Bills M.D.   On: 05/12/2016 11:26    Micro Results      Recent Results (from the past 240 hour(s))  Culture, blood (routine x 2)     Status: None (Preliminary result)   Collection Time: 05/10/16  9:29 PM  Result Value Ref Range Status   Specimen Description BLOOD LEFT FOREARM  Final   Special Requests BOTTLES DRAWN AEROBIC AND ANAEROBIC 5CC  Final   Culture NO GROWTH 2 DAYS  Final   Report Status PENDING  Incomplete  Culture, blood (routine x 2)     Status: None (Preliminary result)   Collection Time: 05/10/16 10:10 PM  Result Value Ref Range Status   Specimen Description BLOOD RIGHT HAND  Final   Special Requests BOTTLES DRAWN AEROBIC AND ANAEROBIC 5CC  Final   Culture NO GROWTH 2 DAYS  Final   Report Status PENDING  Incomplete  Urine culture     Status: Abnormal   Collection Time: 05/10/16 11:21 PM  Result Value Ref Range Status   Specimen Description URINE, CATHETERIZED  Final   Special Requests NONE  Final   Culture >=100,000 COLONIES/mL LACTOBACILLUS SPECIES (A)  Final   Report Status 05/12/2016 FINAL  Final  MRSA PCR Screening     Status: None   Collection Time: 05/11/16 12:52 AM  Result Value Ref Range Status   MRSA by PCR NEGATIVE NEGATIVE Final    Comment:        The GeneXpert MRSA Assay (FDA approved for NASAL specimens only), is one component of a comprehensive MRSA colonization surveillance program. It is not intended to diagnose MRSA infection  nor to guide or monitor treatment for MRSA infections.    Today   Subjective    Joylene Wescott today has no headache,no chest abdominal pain,no new weakness tingling or numbness, feels much better .   Objective   Blood pressure 118/47, pulse 82, temperature 98.1 F (36.7 C), temperature  source Oral, resp. rate 16, height 5' (1.524 m), weight 54.432 kg (120 lb), SpO2 100 %.   Intake/Output Summary (Last 24 hours) at 05/14/16 0923 Last data filed at 05/13/16 0946  Gross per 24 hour  Intake    100 ml  Output      0 ml  Net    100 ml    Exam Somnolent but answers question,  [place].AT,PERRAL Supple Neck,No JVD, No cervical lymphadenopathy appriciated.  Symmetrical Chest wall movement, Good air movement bilaterally, CTAB RRR,No Gallops,Rubs or new Murmurs, No Parasternal Heave +ve B.Sounds, Abd Soft, Non tender, No organomegaly appriciated, No rebound -guarding or rigidity. No Cyanosis, Clubbing or edema, No new Rash or bruise   Data Review   CBC w Diff:  Lab Results  Component Value Date   WBC 10.3 05/12/2016   HGB 10.0* 05/12/2016   HCT 32.0* 05/12/2016   PLT 240 05/12/2016   LYMPHOPCT 16 05/10/2016   MONOPCT 6 05/10/2016   EOSPCT 1 05/10/2016   BASOPCT 0 05/10/2016    CMP:  Lab Results  Component Value Date   NA 139 05/12/2016   K 4.6 05/12/2016   CL 114* 05/12/2016   CO2 17* 05/12/2016   BUN 67* 05/12/2016   CREATININE 1.70* 05/12/2016   PROT 5.0* 05/12/2016   ALBUMIN 1.9* 05/12/2016   BILITOT 0.7 05/12/2016   ALKPHOS 58 05/12/2016   AST 40 05/12/2016   ALT 23 05/12/2016    Total Time in preparing paper work, data evaluation and todays exam - 35 minutes  Susa Raring K M.D on 05/14/2016 at 9:23 AM  [facility]   Office  [phone]

## 2016-05-16 LAB — CULTURE, BLOOD (ROUTINE X 2)
Culture: NO GROWTH
Culture: NO GROWTH

## 2016-06-22 DEATH — deceased

## 2016-07-17 IMAGING — CR DG PELVIS 1-2V
2 series · 2 of 2 positions shown · non-contrast
Comparison: None.

CLINICAL DATA: Found on floor by daughter, complaining of left knee
pain.

EXAM:
PELVIS - 1-2 VIEW

[x pelvis (1 of 2)]
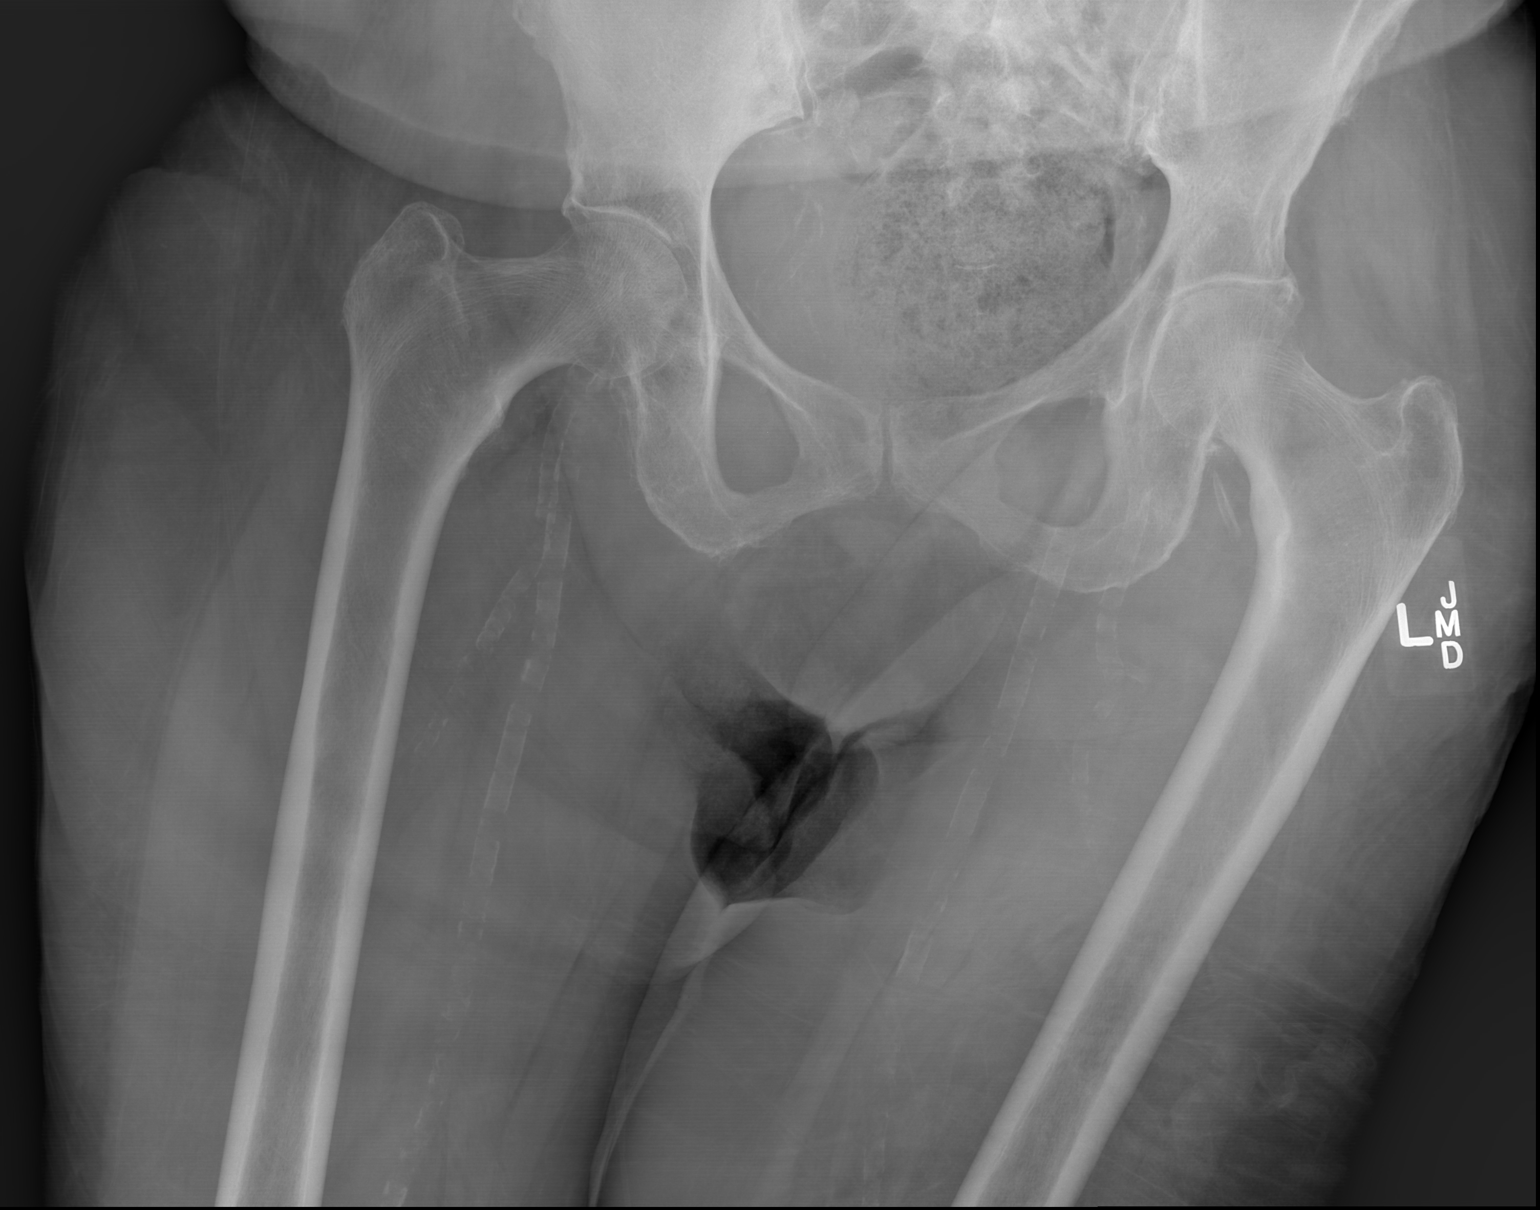

[x pelvis (2 of 2)]
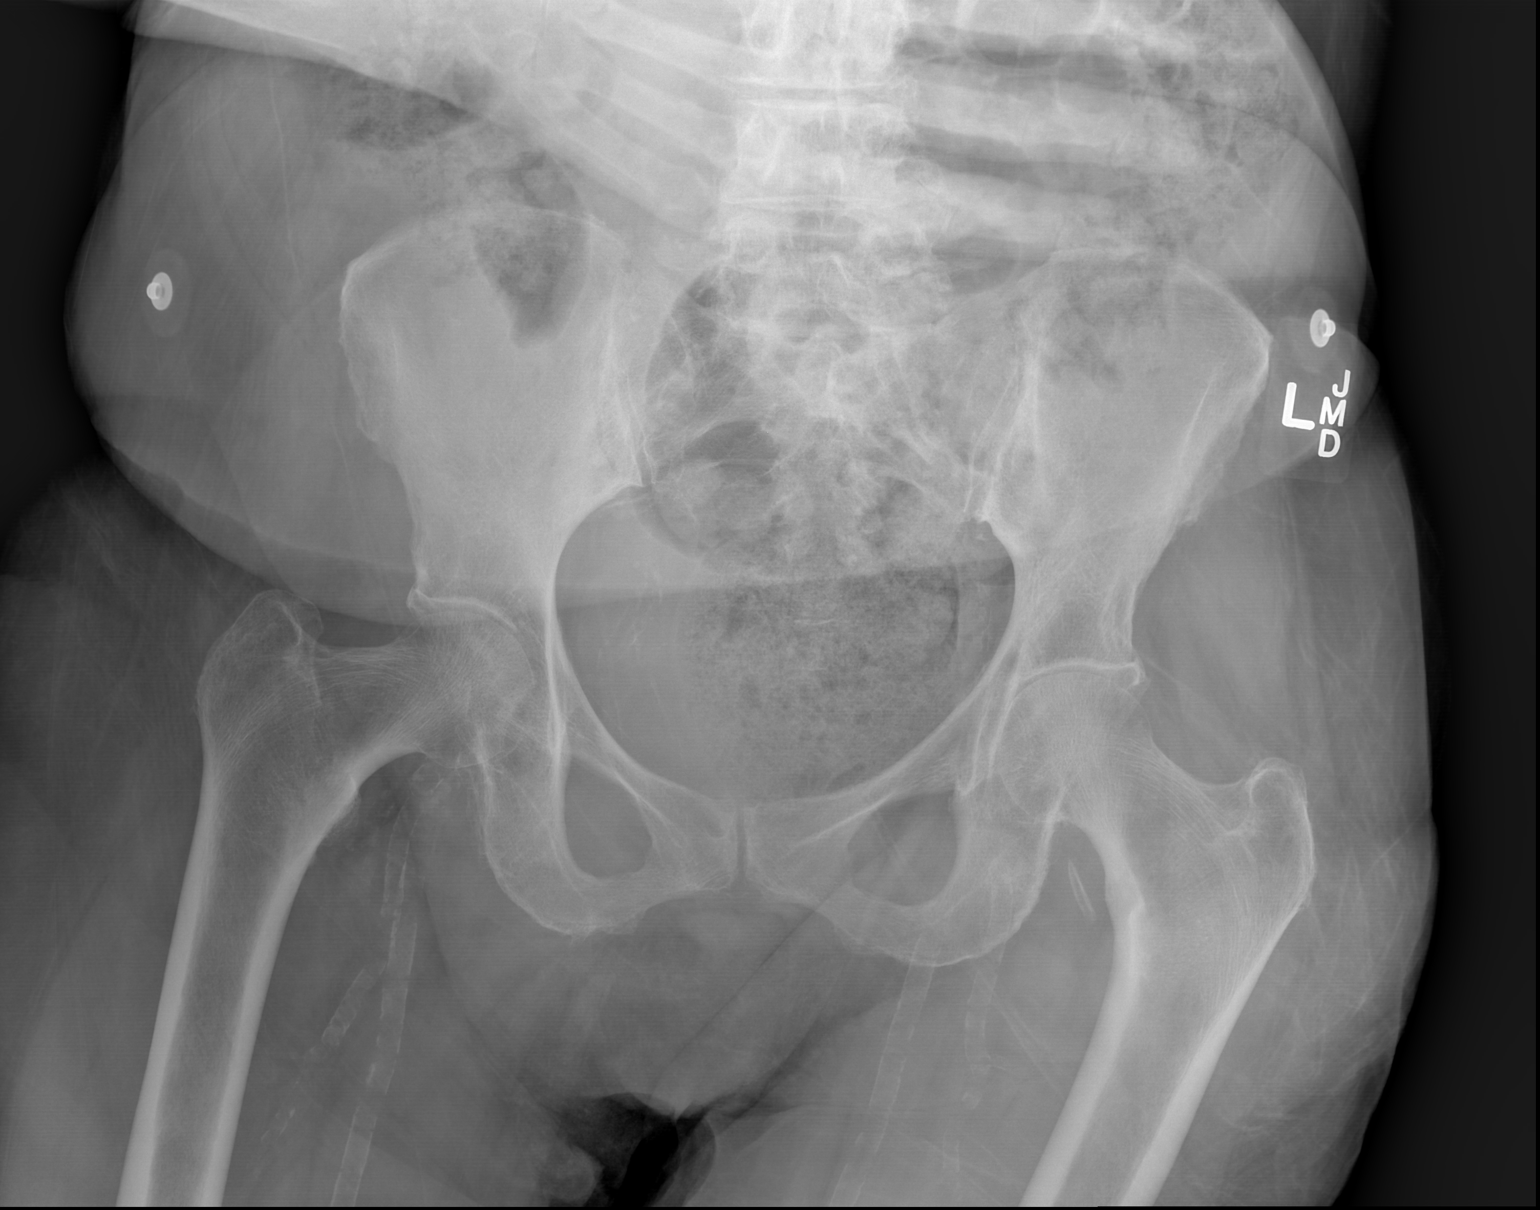

[2 of 2 positions shown; findings below may reference images not displayed]

FINDINGS: There is no evidence of pelvic fracture or dislocation. There is
narrow bilateral hip joint spaces.
IMPRESSION: No acute fracture or dislocation. Degenerative joint changes of
bilateral hips.
